# Patient Record
Sex: Female | Born: 1950 | Hispanic: Refuse to answer | Smoking: Former smoker
Health system: Southern US, Community
[De-identification: ages and names within clinical notes are randomized; demographics above are authoritative.]

## PROBLEM LIST (undated history)

## (undated) DIAGNOSIS — R413 Other amnesia: Secondary | ICD-10-CM

## (undated) DIAGNOSIS — G8929 Other chronic pain: Secondary | ICD-10-CM

## (undated) DIAGNOSIS — M797 Fibromyalgia: Secondary | ICD-10-CM

## (undated) DIAGNOSIS — K219 Gastro-esophageal reflux disease without esophagitis: Secondary | ICD-10-CM

## (undated) DIAGNOSIS — I4891 Unspecified atrial fibrillation: Secondary | ICD-10-CM

## (undated) DIAGNOSIS — M1990 Unspecified osteoarthritis, unspecified site: Secondary | ICD-10-CM

## (undated) DIAGNOSIS — G629 Polyneuropathy, unspecified: Secondary | ICD-10-CM

## (undated) DIAGNOSIS — F319 Bipolar disorder, unspecified: Secondary | ICD-10-CM

## (undated) DIAGNOSIS — E119 Type 2 diabetes mellitus without complications: Secondary | ICD-10-CM

## (undated) DIAGNOSIS — Z950 Presence of cardiac pacemaker: Secondary | ICD-10-CM

## (undated) DIAGNOSIS — R112 Nausea with vomiting, unspecified: Secondary | ICD-10-CM

## (undated) DIAGNOSIS — F32A Depression, unspecified: Secondary | ICD-10-CM

## (undated) DIAGNOSIS — B029 Zoster without complications: Secondary | ICD-10-CM

## (undated) DIAGNOSIS — F329 Major depressive disorder, single episode, unspecified: Secondary | ICD-10-CM

## (undated) DIAGNOSIS — G2581 Restless legs syndrome: Secondary | ICD-10-CM

## (undated) DIAGNOSIS — F4310 Post-traumatic stress disorder, unspecified: Secondary | ICD-10-CM

## (undated) DIAGNOSIS — G4730 Sleep apnea, unspecified: Secondary | ICD-10-CM

## (undated) DIAGNOSIS — J449 Chronic obstructive pulmonary disease, unspecified: Secondary | ICD-10-CM

## (undated) DIAGNOSIS — D649 Anemia, unspecified: Secondary | ICD-10-CM

## (undated) HISTORY — PX: SHOULDER ARTHROSCOPY: SHX128

## (undated) HISTORY — PX: CHOLECYSTECTOMY: SHX55

## (undated) HISTORY — PX: ABDOMINAL HYSTERECTOMY: SHX81

## (undated) HISTORY — PX: BREAST SURGERY: SHX581

## (undated) HISTORY — PX: BUNIONECTOMY: SHX129

## (undated) HISTORY — PX: BACK SURGERY: SHX140

## (undated) HISTORY — PX: APPENDECTOMY: SHX54

## (undated) HISTORY — PX: INSERT / REPLACE / REMOVE PACEMAKER: SUR710

---
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
## 1997-07-01 ENCOUNTER — Encounter: Admission: RE | Admit: 1997-07-01 | Discharge: 1997-09-29 | Payer: Self-pay | Admitting: Orthopedic Surgery

## 1997-10-12 ENCOUNTER — Emergency Department (HOSPITAL_COMMUNITY): Admission: EM | Admit: 1997-10-12 | Discharge: 1997-10-12 | Payer: Self-pay | Admitting: Emergency Medicine

## 1997-10-12 ENCOUNTER — Encounter: Payer: Self-pay | Admitting: Emergency Medicine

## 1997-10-13 ENCOUNTER — Emergency Department (HOSPITAL_COMMUNITY): Admission: EM | Admit: 1997-10-13 | Discharge: 1997-10-13 | Payer: Self-pay | Admitting: Emergency Medicine

## 1997-10-14 ENCOUNTER — Ambulatory Visit (HOSPITAL_COMMUNITY): Admission: RE | Admit: 1997-10-14 | Discharge: 1997-10-14 | Payer: Self-pay | Admitting: Urology

## 1997-10-31 ENCOUNTER — Ambulatory Visit (HOSPITAL_BASED_OUTPATIENT_CLINIC_OR_DEPARTMENT_OTHER): Admission: RE | Admit: 1997-10-31 | Discharge: 1997-10-31 | Payer: Self-pay | Admitting: Orthopedic Surgery

## 1998-11-16 ENCOUNTER — Emergency Department (HOSPITAL_COMMUNITY): Admission: EM | Admit: 1998-11-16 | Discharge: 1998-11-16 | Payer: Self-pay | Admitting: Emergency Medicine

## 1998-11-16 ENCOUNTER — Encounter: Payer: Self-pay | Admitting: Emergency Medicine

## 1999-04-13 ENCOUNTER — Encounter: Admission: RE | Admit: 1999-04-13 | Discharge: 1999-05-01 | Payer: Self-pay | Admitting: Orthopedic Surgery

## 1999-05-27 ENCOUNTER — Encounter (INDEPENDENT_AMBULATORY_CARE_PROVIDER_SITE_OTHER): Payer: Self-pay | Admitting: Specialist

## 1999-05-27 ENCOUNTER — Ambulatory Visit (HOSPITAL_COMMUNITY): Admission: RE | Admit: 1999-05-27 | Discharge: 1999-05-27 | Payer: Self-pay | Admitting: Gastroenterology

## 1999-08-10 ENCOUNTER — Encounter: Payer: Self-pay | Admitting: Family Medicine

## 1999-08-10 ENCOUNTER — Encounter: Admission: RE | Admit: 1999-08-10 | Discharge: 1999-08-10 | Payer: Self-pay | Admitting: Family Medicine

## 2000-02-09 ENCOUNTER — Encounter: Admission: RE | Admit: 2000-02-09 | Discharge: 2000-05-09 | Payer: Self-pay | Admitting: Family Medicine

## 2000-05-11 ENCOUNTER — Encounter: Payer: Self-pay | Admitting: Family Medicine

## 2000-05-11 ENCOUNTER — Encounter: Admission: RE | Admit: 2000-05-11 | Discharge: 2000-05-11 | Payer: Self-pay | Admitting: Family Medicine

## 2001-01-06 ENCOUNTER — Ambulatory Visit (HOSPITAL_COMMUNITY): Admission: RE | Admit: 2001-01-06 | Discharge: 2001-01-06 | Payer: Self-pay | Admitting: Family Medicine

## 2001-01-06 ENCOUNTER — Encounter: Admission: RE | Admit: 2001-01-06 | Discharge: 2001-01-06 | Payer: Self-pay | Admitting: Family Medicine

## 2001-01-06 ENCOUNTER — Encounter: Payer: Self-pay | Admitting: Family Medicine

## 2001-08-18 ENCOUNTER — Ambulatory Visit (HOSPITAL_COMMUNITY): Admission: RE | Admit: 2001-08-18 | Discharge: 2001-08-18 | Payer: Self-pay | Admitting: Family Medicine

## 2001-08-18 ENCOUNTER — Encounter: Payer: Self-pay | Admitting: Family Medicine

## 2001-08-23 ENCOUNTER — Encounter: Payer: Self-pay | Admitting: Family Medicine

## 2001-08-23 ENCOUNTER — Encounter: Admission: RE | Admit: 2001-08-23 | Discharge: 2001-08-23 | Payer: Self-pay | Admitting: Family Medicine

## 2001-09-14 ENCOUNTER — Encounter: Admission: RE | Admit: 2001-09-14 | Discharge: 2001-10-26 | Payer: Self-pay | Admitting: Neurosurgery

## 2001-12-11 ENCOUNTER — Encounter: Admission: RE | Admit: 2001-12-11 | Discharge: 2002-02-01 | Payer: Self-pay | Admitting: Neurosurgery

## 2001-12-20 ENCOUNTER — Encounter: Admission: RE | Admit: 2001-12-20 | Discharge: 2001-12-20 | Payer: Self-pay | Admitting: Family Medicine

## 2001-12-20 ENCOUNTER — Encounter: Payer: Self-pay | Admitting: Family Medicine

## 2002-01-02 ENCOUNTER — Encounter: Payer: Self-pay | Admitting: Emergency Medicine

## 2002-01-02 ENCOUNTER — Observation Stay (HOSPITAL_COMMUNITY): Admission: EM | Admit: 2002-01-02 | Discharge: 2002-01-03 | Payer: Self-pay | Admitting: Emergency Medicine

## 2002-01-02 ENCOUNTER — Encounter: Payer: Self-pay | Admitting: Neurology

## 2002-01-04 ENCOUNTER — Encounter: Payer: Self-pay | Admitting: Emergency Medicine

## 2002-01-05 ENCOUNTER — Inpatient Hospital Stay (HOSPITAL_COMMUNITY): Admission: EM | Admit: 2002-01-05 | Discharge: 2002-01-09 | Payer: Self-pay | Admitting: Psychiatry

## 2002-03-05 ENCOUNTER — Ambulatory Visit (HOSPITAL_COMMUNITY): Admission: RE | Admit: 2002-03-05 | Discharge: 2002-03-05 | Payer: Self-pay | Admitting: Neurosurgery

## 2002-03-05 ENCOUNTER — Encounter: Payer: Self-pay | Admitting: Neurosurgery

## 2002-07-12 ENCOUNTER — Encounter: Admission: RE | Admit: 2002-07-12 | Discharge: 2002-10-10 | Payer: Self-pay | Admitting: Family Medicine

## 2002-08-13 ENCOUNTER — Encounter: Admission: RE | Admit: 2002-08-13 | Discharge: 2002-08-13 | Payer: Self-pay | Admitting: Family Medicine

## 2002-08-13 ENCOUNTER — Encounter: Payer: Self-pay | Admitting: Family Medicine

## 2002-09-10 ENCOUNTER — Emergency Department (HOSPITAL_COMMUNITY): Admission: EM | Admit: 2002-09-10 | Discharge: 2002-09-10 | Payer: Self-pay | Admitting: Emergency Medicine

## 2002-09-10 ENCOUNTER — Encounter: Payer: Self-pay | Admitting: Emergency Medicine

## 2003-01-15 ENCOUNTER — Encounter: Admission: RE | Admit: 2003-01-15 | Discharge: 2003-01-15 | Payer: Self-pay | Admitting: Orthopedic Surgery

## 2003-02-12 ENCOUNTER — Encounter: Admission: RE | Admit: 2003-02-12 | Discharge: 2003-02-12 | Payer: Self-pay | Admitting: Family Medicine

## 2003-03-19 ENCOUNTER — Encounter: Admission: RE | Admit: 2003-03-19 | Discharge: 2003-03-19 | Payer: Self-pay | Admitting: Orthopedic Surgery

## 2003-04-15 ENCOUNTER — Ambulatory Visit (HOSPITAL_COMMUNITY): Admission: RE | Admit: 2003-04-15 | Discharge: 2003-04-15 | Payer: Self-pay | Admitting: Neurology

## 2003-08-16 ENCOUNTER — Ambulatory Visit (HOSPITAL_COMMUNITY): Admission: RE | Admit: 2003-08-16 | Discharge: 2003-08-16 | Payer: Self-pay | Admitting: *Deleted

## 2003-09-04 ENCOUNTER — Ambulatory Visit (HOSPITAL_COMMUNITY): Admission: RE | Admit: 2003-09-04 | Discharge: 2003-09-04 | Payer: Self-pay | Admitting: *Deleted

## 2003-09-06 ENCOUNTER — Encounter: Admission: RE | Admit: 2003-09-06 | Discharge: 2003-09-06 | Payer: Self-pay | Admitting: *Deleted

## 2003-12-02 ENCOUNTER — Encounter: Admission: RE | Admit: 2003-12-02 | Discharge: 2004-03-01 | Payer: Self-pay | Admitting: *Deleted

## 2003-12-16 ENCOUNTER — Inpatient Hospital Stay (HOSPITAL_COMMUNITY): Admission: RE | Admit: 2003-12-16 | Discharge: 2003-12-20 | Payer: Self-pay | Admitting: *Deleted

## 2004-03-11 ENCOUNTER — Encounter: Admission: RE | Admit: 2004-03-11 | Discharge: 2004-06-09 | Payer: Self-pay | Admitting: *Deleted

## 2004-07-16 ENCOUNTER — Other Ambulatory Visit (HOSPITAL_COMMUNITY): Admission: RE | Admit: 2004-07-16 | Discharge: 2004-07-28 | Payer: Self-pay | Admitting: Psychiatry

## 2005-07-12 ENCOUNTER — Emergency Department (HOSPITAL_COMMUNITY): Admission: EM | Admit: 2005-07-12 | Discharge: 2005-07-12 | Payer: Self-pay | Admitting: Emergency Medicine

## 2005-07-15 ENCOUNTER — Ambulatory Visit (HOSPITAL_COMMUNITY): Admission: RE | Admit: 2005-07-15 | Discharge: 2005-07-15 | Payer: Self-pay | Admitting: Urology

## 2005-12-20 ENCOUNTER — Encounter: Admission: RE | Admit: 2005-12-20 | Discharge: 2006-03-20 | Payer: Self-pay | Admitting: Family Medicine

## 2006-02-03 ENCOUNTER — Ambulatory Visit (HOSPITAL_BASED_OUTPATIENT_CLINIC_OR_DEPARTMENT_OTHER): Admission: RE | Admit: 2006-02-03 | Discharge: 2006-02-03 | Payer: Self-pay | Admitting: Orthopedic Surgery

## 2006-06-15 ENCOUNTER — Encounter: Admission: RE | Admit: 2006-06-15 | Discharge: 2006-06-15 | Payer: Self-pay | Admitting: Family Medicine

## 2006-06-24 ENCOUNTER — Encounter (INDEPENDENT_AMBULATORY_CARE_PROVIDER_SITE_OTHER): Payer: Self-pay | Admitting: Diagnostic Radiology

## 2006-06-24 ENCOUNTER — Encounter: Admission: RE | Admit: 2006-06-24 | Discharge: 2006-06-24 | Payer: Self-pay | Admitting: Family Medicine

## 2006-10-05 ENCOUNTER — Emergency Department (HOSPITAL_COMMUNITY): Admission: EM | Admit: 2006-10-05 | Discharge: 2006-10-06 | Payer: Self-pay | Admitting: Emergency Medicine

## 2006-10-25 ENCOUNTER — Encounter: Admission: RE | Admit: 2006-10-25 | Discharge: 2006-10-25 | Payer: Self-pay | Admitting: Family Medicine

## 2006-10-26 ENCOUNTER — Ambulatory Visit (HOSPITAL_BASED_OUTPATIENT_CLINIC_OR_DEPARTMENT_OTHER): Admission: RE | Admit: 2006-10-26 | Discharge: 2006-10-26 | Payer: Self-pay | Admitting: Urology

## 2007-01-30 ENCOUNTER — Inpatient Hospital Stay (HOSPITAL_COMMUNITY): Admission: AD | Admit: 2007-01-30 | Discharge: 2007-02-03 | Payer: Self-pay | Admitting: *Deleted

## 2007-01-30 ENCOUNTER — Ambulatory Visit: Payer: Self-pay | Admitting: *Deleted

## 2007-03-15 ENCOUNTER — Ambulatory Visit (HOSPITAL_COMMUNITY): Admission: RE | Admit: 2007-03-15 | Discharge: 2007-03-15 | Payer: Self-pay | Admitting: Neurology

## 2007-05-15 ENCOUNTER — Encounter (HOSPITAL_COMMUNITY): Admission: RE | Admit: 2007-05-15 | Discharge: 2007-08-13 | Payer: Self-pay | Admitting: Neurology

## 2007-06-16 ENCOUNTER — Encounter: Admission: RE | Admit: 2007-06-16 | Discharge: 2007-06-16 | Payer: Self-pay | Admitting: Family Medicine

## 2008-12-24 ENCOUNTER — Inpatient Hospital Stay (HOSPITAL_COMMUNITY): Admission: EM | Admit: 2008-12-24 | Discharge: 2008-12-24 | Payer: Self-pay | Admitting: Emergency Medicine

## 2009-01-07 ENCOUNTER — Inpatient Hospital Stay (HOSPITAL_COMMUNITY): Admission: EM | Admit: 2009-01-07 | Discharge: 2009-01-10 | Payer: Self-pay | Admitting: Emergency Medicine

## 2009-06-04 ENCOUNTER — Ambulatory Visit (HOSPITAL_BASED_OUTPATIENT_CLINIC_OR_DEPARTMENT_OTHER): Admission: RE | Admit: 2009-06-04 | Discharge: 2009-06-04 | Payer: Self-pay | Admitting: Orthopedic Surgery

## 2010-02-11 IMAGING — CT CT ABD-PELV W/ CM
2 of 5 series · 17 of 46 positions shown, 19 images · IV contrast (agent unspecified)
Comparison: 12/23/2008

CLINICAL DATA: Abdominal and pelvic pain.  Fever.

CT ABDOMEN AND PELVIS WITH CONTRAST
TECHNIQUE: Multidetector CT imaging of the abdomen and pelvis was
performed following the standard protocol during bolus
administration of intravenous contrast.
Contrast: 100 ml Hmnipaque-FHH

[Series 2: rtn ap with st · axial · 0.66mm/px · z∈[+352,+752]mm · 14 of 90 slices shown, 16 images]
[im 5/90  soft-tissue]
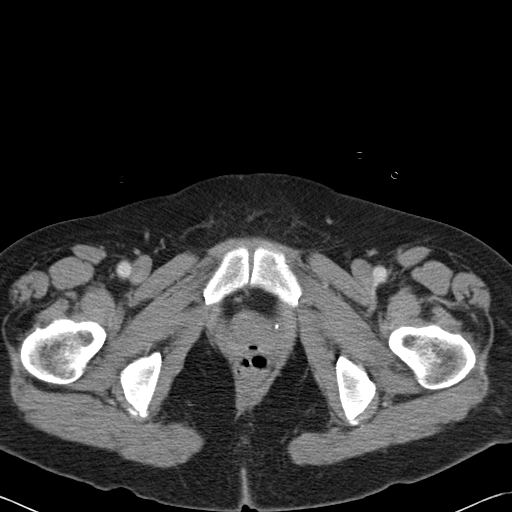
[im 5/90  bone]
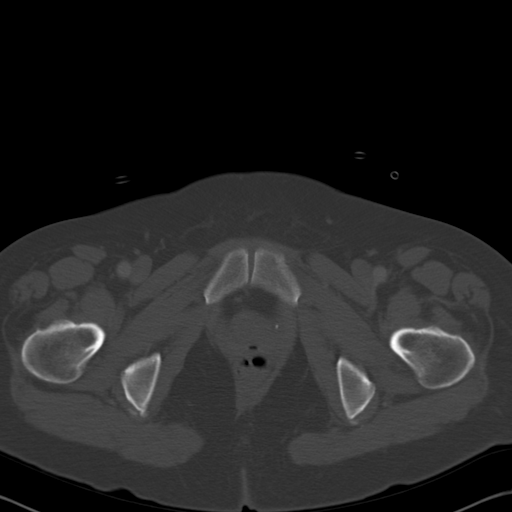
[im 10/90  soft-tissue]
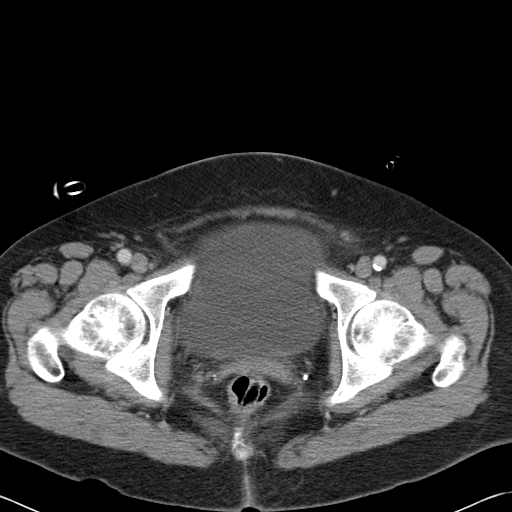
[im 19/90  soft-tissue]
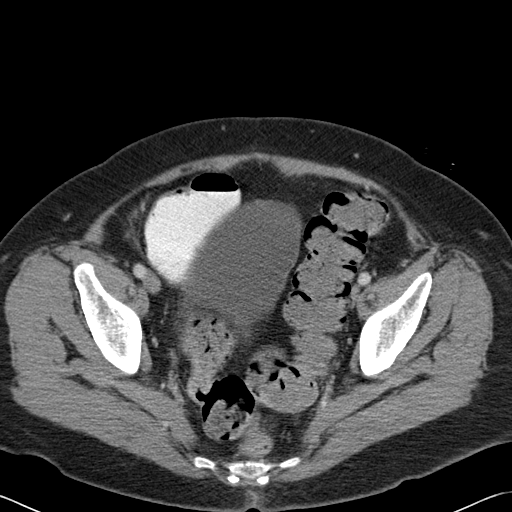
[im 24/90  soft-tissue]
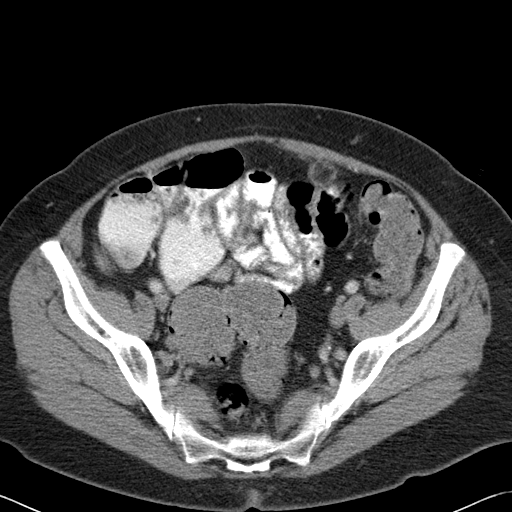
[im 29/90  soft-tissue]
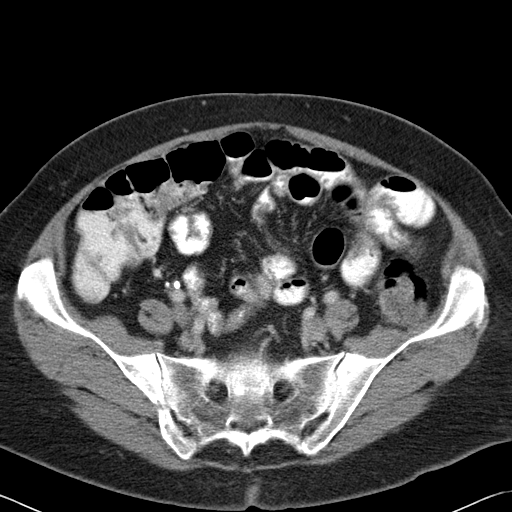
[im 38/90  soft-tissue]
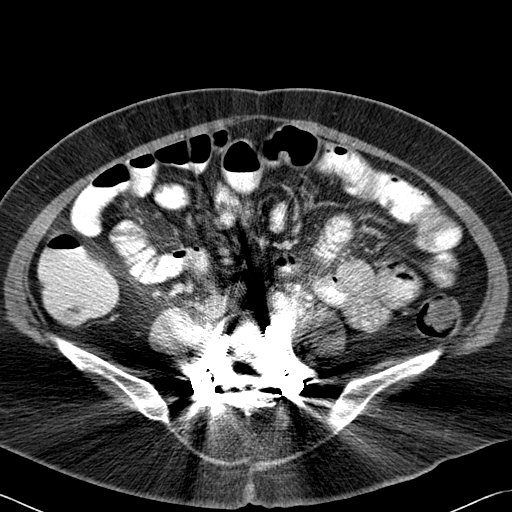
[im 43/90  soft-tissue]
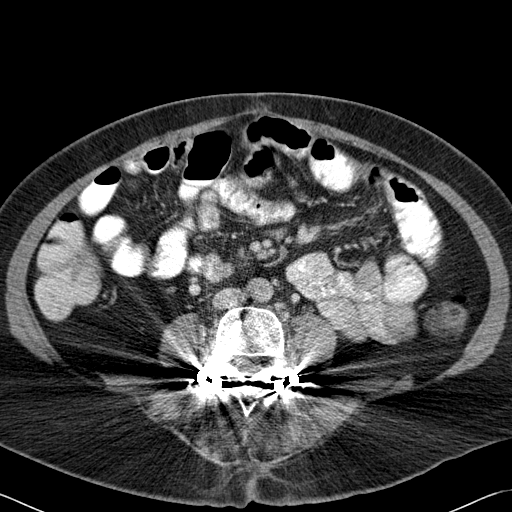
[im 47/90  soft-tissue]
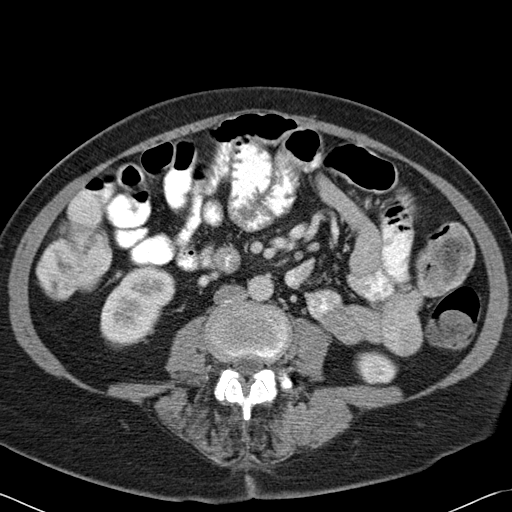
[im 52/90  soft-tissue]
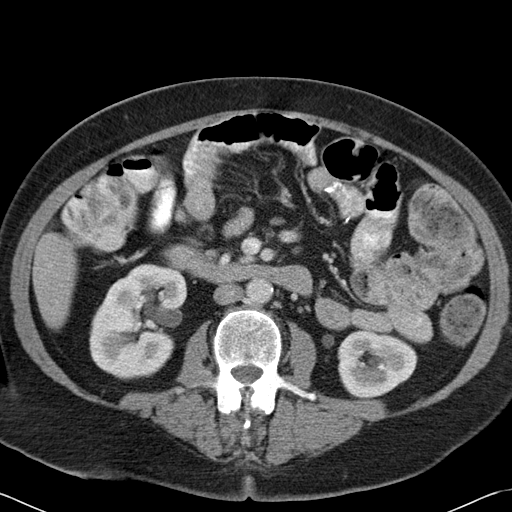
[im 52/90  bone]
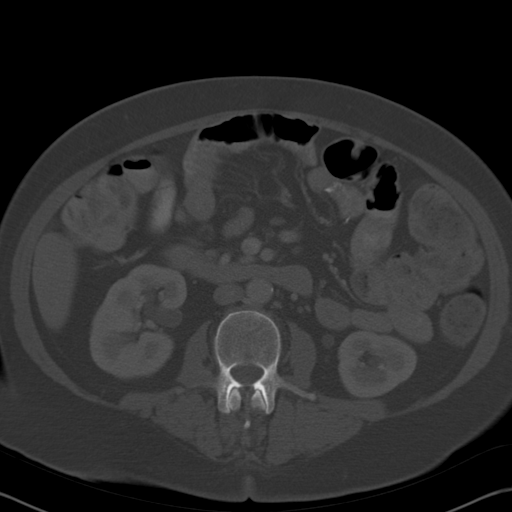
[im 61/90  soft-tissue]
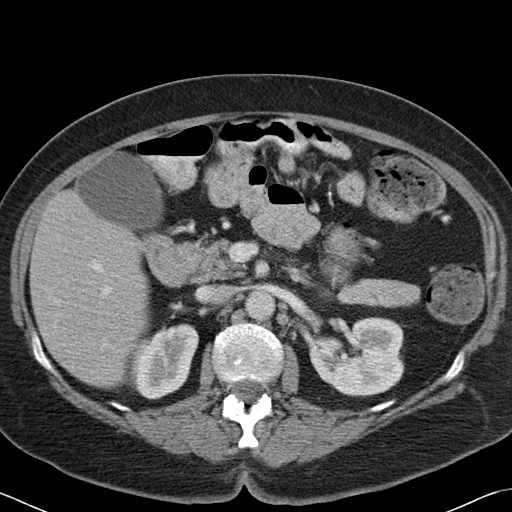
[im 66/90  soft-tissue]
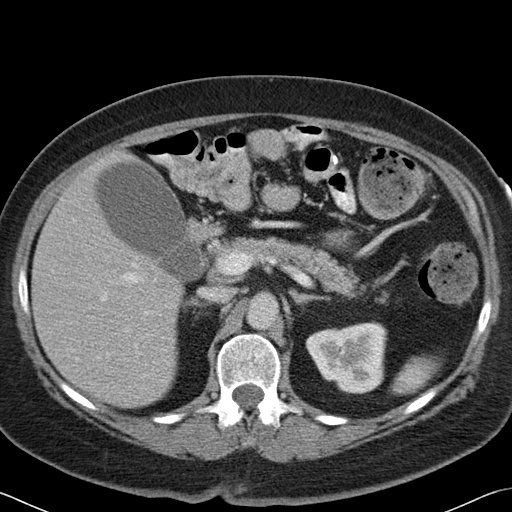
[im 71/90  soft-tissue]
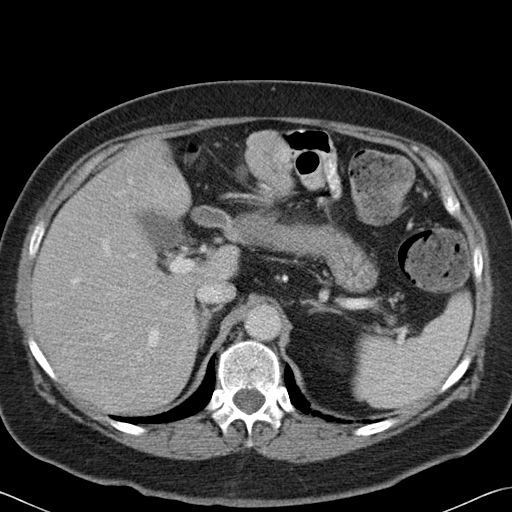
[im 80/90  soft-tissue]
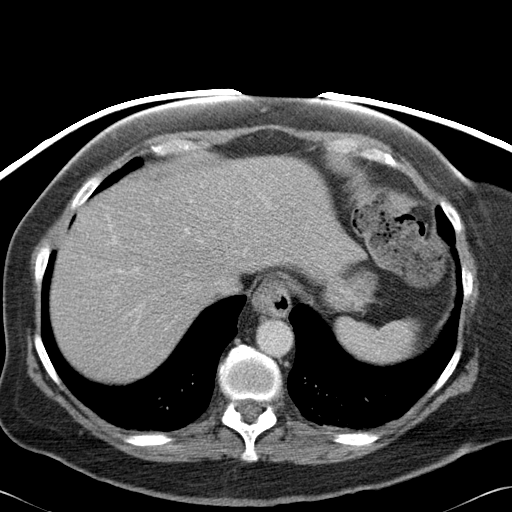
[im 85/90  soft-tissue]
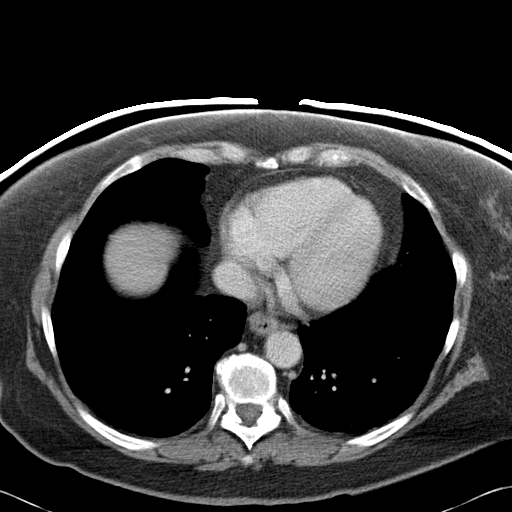

[Series 602: <mpr thick range> · coronal · 0.91mm/px · 3 of 82 slices shown]
[im 28/82  soft-tissue]
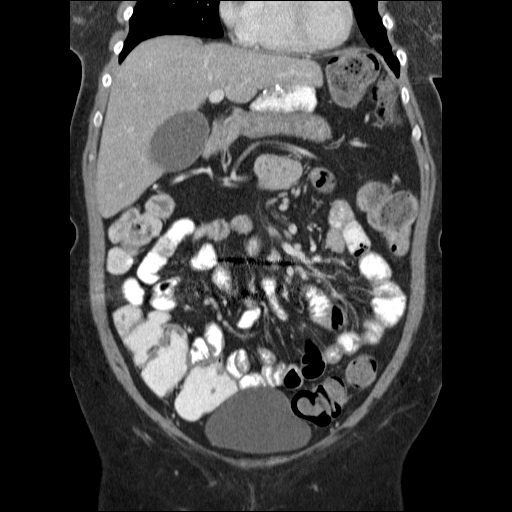
[im 37/82  soft-tissue]
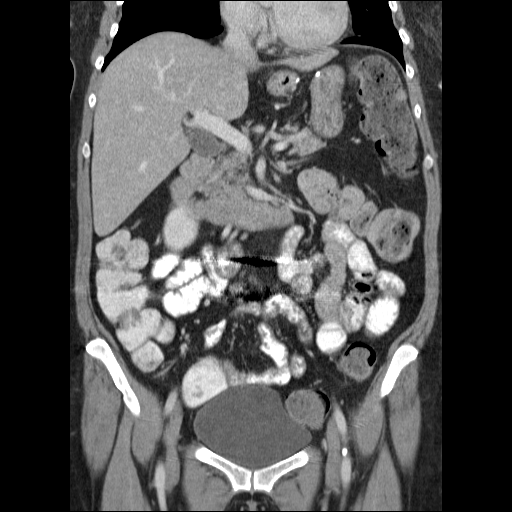
[im 46/82  soft-tissue]
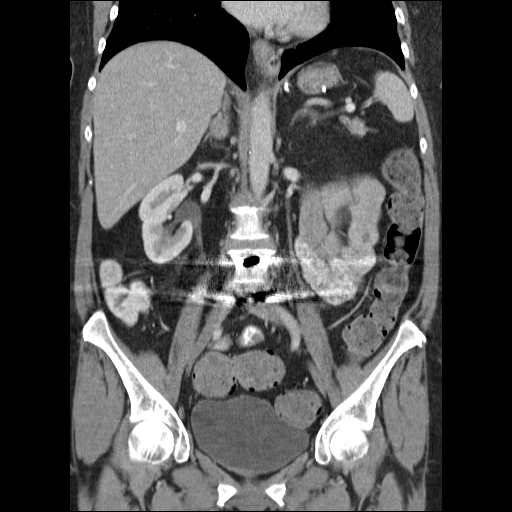

[17 of 46 positions shown; findings below may reference images not displayed]

FINDINGS: The liver, spleen, pancreas, adrenal glands, and kidneys
are normal.  The patient has had previous gastric bypass surgery.
There are no dilated loops of large or small bowel.  Uterus and
ovaries have been removed.  There is no diverticular disease or
free fluid in the pelvis.  Bladder appears normal.  No acute bony
abnormalities.  Evidence of previous lower lumbar fusion.

Stable tiny area of intra-abdominal fat necrosis in the left side
of the pelvis anteriorly.
IMPRESSION: No acute abnormality of the abdomen or pelvis.

## 2010-03-22 LAB — COMPREHENSIVE METABOLIC PANEL
AST: 16 U/L (ref 0–37)
Albumin: 3.1 g/dL — ABNORMAL LOW (ref 3.5–5.2)
Albumin: 3.3 g/dL — ABNORMAL LOW (ref 3.5–5.2)
Alkaline Phosphatase: 104 U/L (ref 39–117)
BUN: 11 mg/dL (ref 6–23)
CO2: 21 mEq/L (ref 19–32)
Calcium: 8.5 mg/dL (ref 8.4–10.5)
Chloride: 112 mEq/L (ref 96–112)
Creatinine, Ser: 0.61 mg/dL (ref 0.4–1.2)
Creatinine, Ser: 0.75 mg/dL (ref 0.4–1.2)
GFR calc Af Amer: >60 mL/min (ref >60)
GFR calc non Af Amer: >60 mL/min (ref >60)
Glucose, Bld: 107 mg/dL — ABNORMAL HIGH (ref 70–99)
Potassium: 3.4 mEq/L — ABNORMAL LOW (ref 3.5–5.1)
Total Bilirubin: 0.5 mg/dL (ref 0.3–1.2)
Total Protein: 6.3 g/dL (ref 6.0–8.3)

## 2010-03-22 LAB — PROTEIN ELECTROPH W RFLX QUANT IMMUNOGLOBULINS
Albumin ELP: 54.9 % — ABNORMAL LOW (ref 55.8–66.1)
Alpha-2-Globulin: 13.4 % — ABNORMAL HIGH (ref 7.1–11.8)
Beta Globulin: 8.5 % — ABNORMAL HIGH (ref 4.7–7.2)
Total Protein ELP: 6.8 g/dL (ref 6.0–8.3)

## 2010-03-22 LAB — CBC
HCT: 23.2 % — ABNORMAL LOW (ref 36.0–46.0)
Hemoglobin: 12.4 g/dL (ref 12.0–15.0)
Hemoglobin: 7.7 g/dL — ABNORMAL LOW (ref 12.0–15.0)
MCHC: 32.5 g/dL (ref 30.0–36.0)
MCHC: 32.8 g/dL (ref 30.0–36.0)
MCHC: 33 g/dL (ref 30.0–36.0)
MCHC: 33.3 g/dL (ref 30.0–36.0)
MCV: 87.6 fL (ref 78.0–100.0)
MCV: 88.3 fL (ref 78.0–100.0)
MCV: 88.6 fL (ref 78.0–100.0)
Platelets: 132 10*3/uL — ABNORMAL LOW (ref 150–400)
Platelets: 172 10*3/uL (ref 150–400)
Platelets: 191 10*3/uL (ref 150–400)
RBC: 2.65 MIL/uL — ABNORMAL LOW (ref 3.87–5.11)
RDW: 14.7 % (ref 11.5–15.5)
RDW: 15.4 % (ref 11.5–15.5)
RDW: 15.5 % (ref 11.5–15.5)
RDW: 15.8 % — ABNORMAL HIGH (ref 11.5–15.5)
WBC: 4.5 10*3/uL (ref 4.0–10.5)
WBC: 7 10*3/uL (ref 4.0–10.5)

## 2010-03-22 LAB — URINALYSIS, ROUTINE W REFLEX MICROSCOPIC
Glucose, UA: NEGATIVE mg/dL
Hgb urine dipstick: NEGATIVE
Specific Gravity, Urine: 1.021 (ref 1.005–1.030)
pH: 5.5 (ref 5.0–8.0)

## 2010-03-22 LAB — DIFFERENTIAL
Basophils Absolute: 0 10*3/uL (ref 0.0–0.1)
Basophils Relative: 0 % (ref 0–1)
Eosinophils Absolute: 0 10*3/uL (ref 0.0–0.7)
Eosinophils Relative: 0 % (ref 0–5)
Eosinophils Relative: 2 % (ref 0–5)
Lymphocytes Relative: 29 % (ref 12–46)
Lymphocytes Relative: 41 % (ref 12–46)
Lymphs Abs: 1.3 10*3/uL (ref 0.7–4.0)
Lymphs Abs: 2.9 10*3/uL (ref 0.7–4.0)
Monocytes Absolute: 0.2 10*3/uL (ref 0.1–1.0)
Monocytes Absolute: 0.4 10*3/uL (ref 0.1–1.0)
Monocytes Relative: 5 % (ref 3–12)
Neutro Abs: 3 10*3/uL (ref 1.7–7.7)
Neutro Abs: 3.6 10*3/uL (ref 1.7–7.7)
Neutrophils Relative %: 66 % (ref 43–77)

## 2010-03-22 LAB — LIPASE, BLOOD: Lipase: 15 U/L (ref 11–59)

## 2010-03-22 LAB — FERRITIN: Ferritin: 24 ng/mL (ref 10–291)

## 2010-03-22 LAB — UIFE/LIGHT CHAINS/TP QN, 24-HR UR
Alpha 1, Urine: DETECTED — AB
Alpha 2, Urine: DETECTED — AB
Free Kappa Lt Chains,Ur: 5.6 mg/dL — ABNORMAL HIGH (ref 0.04–1.51)
Gamma Globulin, Urine: DETECTED — AB

## 2010-03-22 LAB — GLUCOSE, CAPILLARY
Glucose-Capillary: 100 mg/dL — ABNORMAL HIGH (ref 70–99)
Glucose-Capillary: 103 mg/dL — ABNORMAL HIGH (ref 70–99)
Glucose-Capillary: 112 mg/dL — ABNORMAL HIGH (ref 70–99)
Glucose-Capillary: 88 mg/dL (ref 70–99)
Glucose-Capillary: 91 mg/dL (ref 70–99)

## 2010-03-22 LAB — APTT: aPTT: 25 seconds (ref 24–37)

## 2010-03-22 LAB — CROSSMATCH
ABO/RH(D): A POS
Antibody Screen: NEGATIVE

## 2010-03-22 LAB — CULTURE, RESPIRATORY W GRAM STAIN: Culture: NORMAL

## 2010-03-22 LAB — CARDIAC PANEL(CRET KIN+CKTOT+MB+TROPI)
CK, MB: 1.1 ng/mL (ref 0.3–4.0)
Relative Index: 1 (ref 0.0–2.5)
Total CK: 107 U/L (ref 7–177)
Troponin I: 0.03 ng/mL (ref 0.00–0.06)

## 2010-03-22 LAB — BASIC METABOLIC PANEL
CO2: 25 mEq/L (ref 19–32)
Calcium: 8.5 mg/dL (ref 8.4–10.5)
Glucose, Bld: 93 mg/dL (ref 70–99)
Sodium: 138 mEq/L (ref 135–145)

## 2010-03-22 LAB — IRON AND TIBC: Iron: 19 ug/dL — ABNORMAL LOW (ref 42–135)

## 2010-03-22 LAB — EXPECTORATED SPUTUM ASSESSMENT W GRAM STAIN, RFLX TO RESP C

## 2010-03-22 LAB — LITHIUM LEVEL: Lithium Lvl: <0.25 mEq/L — ABNORMAL LOW (ref 0.80–1.40)

## 2010-03-22 LAB — PROTIME-INR: INR: 1.04 (ref 0.00–1.49)

## 2010-03-22 LAB — HEMOCCULT GUIAC POC 1CARD (OFFICE): Fecal Occult Bld: NEGATIVE

## 2010-03-23 LAB — POCT HEMOGLOBIN-HEMACUE: Hemoglobin: 13 g/dL (ref 12.0–15.0)

## 2010-04-06 LAB — HEMOGLOBIN AND HEMATOCRIT, BLOOD
HCT: 35.5 % — ABNORMAL LOW (ref 36.0–46.0)
Hemoglobin: 10.7 g/dL — ABNORMAL LOW (ref 12.0–15.0)
Hemoglobin: 11.6 g/dL — ABNORMAL LOW (ref 12.0–15.0)

## 2010-04-06 LAB — POCT I-STAT, CHEM 8
BUN: 7 mg/dL (ref 6–23)
Calcium, Ion: 1.1 mmol/L — ABNORMAL LOW (ref 1.12–1.32)
Chloride: 110 mEq/L (ref 96–112)
HCT: 39 % (ref 36.0–46.0)
Potassium: 3.9 mEq/L (ref 3.5–5.1)
Sodium: 140 mEq/L (ref 135–145)

## 2010-04-06 LAB — URINALYSIS, ROUTINE W REFLEX MICROSCOPIC
Bilirubin Urine: NEGATIVE
Hgb urine dipstick: NEGATIVE
Nitrite: NEGATIVE
Specific Gravity, Urine: 1.01 (ref 1.005–1.030)
pH: 7 (ref 5.0–8.0)

## 2010-04-06 LAB — PROTIME-INR
INR: 1.04 (ref 0.00–1.49)
Prothrombin Time: 13.5 seconds (ref 11.6–15.2)

## 2010-04-06 LAB — TYPE AND SCREEN
ABO/RH(D): A POS
Antibody Screen: NEGATIVE

## 2010-04-06 LAB — DIFFERENTIAL
Eosinophils Absolute: 0.2 10*3/uL (ref 0.0–0.7)
Lymphocytes Relative: 38 % (ref 12–46)
Lymphs Abs: 4.4 10*3/uL — ABNORMAL HIGH (ref 0.7–4.0)
Monocytes Relative: 5 % (ref 3–12)
Neutrophils Relative %: 54 % (ref 43–77)

## 2010-04-06 LAB — COMPREHENSIVE METABOLIC PANEL
ALT: 17 U/L (ref 0–35)
AST: 18 U/L (ref 0–37)
CO2: 25 mEq/L (ref 19–32)
Calcium: 8.2 mg/dL — ABNORMAL LOW (ref 8.4–10.5)
Creatinine, Ser: 0.49 mg/dL (ref 0.4–1.2)
GFR calc Af Amer: >60 mL/min (ref >60)
GFR calc non Af Amer: >60 mL/min (ref >60)
Sodium: 140 mEq/L (ref 135–145)
Total Protein: 5.5 g/dL — ABNORMAL LOW (ref 6.0–8.3)

## 2010-04-06 LAB — APTT: aPTT: 22 seconds — ABNORMAL LOW (ref 24–37)

## 2010-04-06 LAB — LITHIUM LEVEL: Lithium Lvl: <0.25 mEq/L — ABNORMAL LOW (ref 0.80–1.40)

## 2010-04-06 LAB — CBC
MCV: 89 fL (ref 78.0–100.0)
Platelets: 278 10*3/uL (ref 150–400)
RBC: 4.32 MIL/uL (ref 3.87–5.11)
WBC: 11.5 10*3/uL — ABNORMAL HIGH (ref 4.0–10.5)

## 2010-04-06 LAB — SAMPLE TO BLOOD BANK

## 2010-04-06 LAB — HEMOCCULT GUIAC POC 1CARD (OFFICE): Fecal Occult Bld: POSITIVE

## 2010-05-19 NOTE — Op Note (Signed)
NAME:  Lindsay Payne, Lindsay Payne               ACCOUNT NO.:  [id number]   MEDICAL RECORD NO.:  [id number]          PATIENT TYPE:  AMB   LOCATION:  NESC                         FACILITY:  [facility]   PHYSICIAN:  Excell Seltzer. Annabell Howells, M.D.    DATE OF BIRTH:  04-07-1950   DATE OF PROCEDURE:  10/26/2006  DATE OF DISCHARGE:                               OPERATIVE REPORT   PROCEDURE:  Cystoscopy, right retrograde pyelogram with interpretation,  right ureteroscopy.   PREOPERATIVE DIAGNOSIS:  Right ureterovesical junction stone.   POSTOPERATIVE DIAGNOSIS:  Interval passage of right ureterovesical  junction stone with retained ureteral clot.   SURGEON:  Dr. Bjorn Pippin.   ANESTHESIA:  General.   SPECIMEN:  None.   COMPLICATIONS:  None.   INDICATIONS:  [name] is a 60 year old white female who presented with  acute right flank pain.  She had a CT done in the emergency room that  revealed a 6 mm right distal ureteral stone three days prior her visit.  She had a KUB in the office.  There were calcifications in the pelvis  that were felt to be phleboliths.  The stone was not well seen but on CT  review, the [place] of the stone were not high so it was felt that  it was likely a poorly radiopaque stone and that ureteroscopy was  indicated because her acute symptoms.   FINDINGS AND PROCEDURE:  She was given Cipro.  She was taken operating  room where general anesthetic was induced.  She was placed in the  lithotomy position.  Her perineum and genitalia were prepped with  Betadine solution and she was draped in usual sterile fashion.   Cysto was performed in the usual fashion with a 22-French scope and 12  degrees lens.  Examination revealed a normal urethra.  The bladder wall  was smooth and pale without tumor, stones or inflammation.  The left  ureteral orifice was unremarkable.  The right ureteral orifice had a  small amount of blood/clot at the orifice.   A 5-French open end catheter was placed up  the right ureteral orifice  and contrast was instilled in retrograde fashion.   No obvious filling defects were noted in the ureter from the kidney  down.  There was mild dilation of the ureter.   A 6-French short ureteroscope was then easily passed up the right ureter  to ensure the stone did not flush back toward the kidney.  I was able to  advance the ureteroscope to the level of the UPJ with no evidence of the  stone being seen.  There were inflammatory changes in the distal ureter  consistent with presence of a recent stone and there was a clot that  flushed out after the retrograde.  It was likely causing clot colic and  her pain.  At this point  ureteroscope was removed.  The bladder was drained.  The patient was  taken down from lithotomy position.  Her anesthetic was reversed.  She  was removed to the recovery room in stable condition and there were no  complications.  Excell Seltzer. Annabell Howells, M.D.  Electronically Signed     JJW/MEDQ  D:  10/26/2006  T:  10/27/2006  Job:  [id number]

## 2010-05-22 NOTE — Op Note (Signed)
NAMELASHUN, Lindsay Payne               ACCOUNT NO.:  [id number]   MEDICAL RECORD NO.:  [id number]          PATIENT TYPE:  INP   LOCATION:  0008                         FACILITY[facility]   PHYSICIAN:  Vikki Ports, MDDATE OF BIRTH:  05-22-1950   DATE OF PROCEDURE:  12/16/2003  DATE OF DISCHARGE:                                 OPERATIVE REPORT   PREOPERATIVE DIAGNOSIS:  Morbid obesity.   POSTOPERATIVE DIAGNOSIS:  Morbid obesity.   OPERATION PERFORMED:  Laparoscopic Roux-en-Y gastric bypass, anticolic,  antigastric.   SURGEON:  Vikki Ports, MD   ASSISTANT:  Thornton Park. Daphine Deutscher, MD   ANESTHESIA:  General.   DESCRIPTION OF PROCEDURE:  The patient was taken to the operating room and  placed in supine position after adequate general anesthesia was induced  using endotracheal tube.  The abdomen was prepped and draped in the normal  sterile fashion.  Using the OptiView technique, a 12 mm trocar was placed in  the left upper quadrant and four additional 12 mm ports were placed under  direct visualization after the pneumoperitoneum had been obtained.  An  additional 5 mm trocar was placed in the left abdomen.  A number of  adhesions to the omentum were taken down from the anterior abdominal wall.  The ligament of Treitz was identified and the small bowel was divided using  a 60 mm GIA stapling device, 40 cm distal to the ligament of Treitz.  The  Roux limb was marked with a Penrose drain and 100 cm distal to this was  identified.  The Roux limb was brought up left of the biliary limb and a  side-to-side jejunojejunostomy was performed in the standard fashion using a  4 to 5 mm white load GIA.  The enterotomy was closed with a running 2-0  Vicryl suture.  Tisseel was placed over the anastomosis and the mesenteric  defect was closed with a running silk suture.  I then turned my attention to  the upper abdomen. Nathanson retractor was placed in the upper abdomen.  A  left  lateral segment was retracted anteriorly.  The angle of His was  dissected down with blunt and sharp cautery and a blunt dissector into the  lesser sac.  An area 4 cm distal to the esophagogastric junction was  identified along the lesser curve and a gastric pouch was created after the  lesser sac had been entered.  This took up three firings of the 60 mm blue  load GIA stapler.  The final division was accomplished with an Ewald tube  through the esophagogastric junction.  I was very satisfied with the look of  the pouch and a side-to-side jejunojejunostomy was performed in a standard  fashion with posterior serosal layer of 2-0 Vicryl, side-to-side  gastrojejunostomy was created with a 45 mm white load GIA. The defect was  closed with running 2-0 Vicryl.  With the [facility] through the anastomosis, the  anterior serosal layer was closed.  The upper abdomen was copiously  irrigated and then upper endoscopy was performed after the Roux limb had  been clamped.  This showed no evidence of leak and a very patent  anastomosis.  The length of the pouch was approximately 5 cm.  Drain was placed.  Pneumoperitoneum was released.  Skin was closed with  staples.  The patient tolerated the procedure well and went to PACU in good  condition.  Upper endoscopy will be dictated in a separate report by Molli Hazard  B. Daphine Deutscher, MD.     Lindsay Payne   KRH/MEDQ  D:  12/16/2003  T:  12/16/2003  Job:  [id number]

## 2010-05-22 NOTE — H&P (Signed)
NAME:  Lindsay Payne, Lindsay Payne                         ACCOUNT NO.:  [id number]   MEDICAL RECORD NO.:  [id number]                   PATIENT TYPE:  IPS   LOCATION:  0302                                 [facility]ACILITY[facility]  [facility]   PHYSICIAN:  Geoffery Lyons, M.D.                   DATE OF BIRTH:  06/19/1950   DATE OF ADMISSION:  01/05/2002  DATE OF DISCHARGE:  01/09/2002                         PSYCHIATRIC ADMISSION ASSESSMENT   IDENTIFYING INFORMATION:  This is a 60 year old, white, married female who  presents voluntarily.   REASON FOR ADMISSION AND SYMPTOMS:  She states that she was admitted for  depression and psychosis.  She states that she is taking hydrocodone for  back pain and Elavil, but it was not working.  She is in need for medication  stabilization.  Will participate in groups and work on Pharmacologist.  The  patient is to be going to IOP, but is in need of referral to a therapist or  a counselor.   The patient had been seen several times in the last few weeks in the  emergency room and was worked up for spells where she could not move her  arms and legs and her tongue would feel thick.  She would stare.  She stays  in bed.  She says that she is not hopeless, helpless, or worthless.  She  denies active suicidal ideation, but if God would take her, she is ready.  She thinks that she worries too much and she does acknowledge elevated  sleep.   PAST PSYCHIATRIC HISTORY:  She has been treated in the past with Elavil and  Prozac.  She was followed by Dr. Renae Fickle or Bryan Lemma. Manus Gunning, M.D.  Twenty-  five years ago, she was divorced with two children and tried to suicide.  She was hospitalized at [facility] and stayed a few weeks.  Since that time,  she has taken Prozac.   SOCIAL HISTORY:  This is her second married.  She has been married 21 years  to this husband.  She has a daughter age 76 and a son age 31.  She has been  disabled for 10 years since her first back surgery.   FAMILY HISTORY:   Depression has always been in her family.   ALCOHOL AND DRUG USE:  She denies it.   PRIMARY MEDICAL DOCTOR:  Bryan Lemma. Manus Gunning, M.D.   PAST MEDICAL HISTORY:  She is treated for diabetes, high cholesterol,  hypertension, and gastrointestinal reflux disease.   CURRENT MEDICATIONS:  1. Nexium 40 mg p.o. daily.  2. Estradiol 2 mg daily.  3. Paxil 30 mg daily.  4. Crestor 10 mg daily.  5. Metformin 1000 mg daily.  6. Elavil 50 mg at h.s.  7. Altace 5 mg daily.  8. Actos 45 mg daily.   DRUG ALLERGIES:  PENICILLIN and SULFA (she gets hives) and CODEINE (she gets  sick).  PHYSICAL EXAMINATION:  GENERAL APPEARANCE:  A somewhat obese white female in  no acute distress.  She had no remarkable physical findings.  LUNGS:  Clear to auscultation and percussion.  HEART:  Regular rate and rhythm.  EXTREMITIES:  She had no cyanosis, clubbing, or edema.   REVIEW OF SYSTEMS:  Except for her back surgeries, she feels that she is in  relatively good shape physically.  Her skin revealed surgical scars.  She  does endorse frequent headaches.  She currently is being treated for a sinus  infection.  She has had recent bronchitis.  She has reflux and irritable  bowel syndrome.  She is status post a total abdominal hysterectomy and does  have hot flashes, but is treated for that.  She has had type 2 diabetes for  one year.   MENTAL STATUS EXAMINATION:  Her mood was bland.  Her affect was flat.  She  was cooperative.  Her sensorium was clear.  Her eye contact was poor.  She  spoke slowly.  Her associations were tight.  She was oriented x 3.  Her  psychomotor was somewhat depressed.  Her thoughts revealed some paranoia and  some thought blocking.  She was neatly groomed and dressed, although obese.  Her memory had some impairment.  Her judgment and insight were fair.  Her  concentration was decreased.  Her intelligence was average.   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   AXIS II:  Deferred.   AXIS III:  1. Diabetes, type 2.  2. Gastrointestinal reflux disease.  3. Hypercholesterolemia.  4. Hypertension.  5. Degenerative disk disease.   PLAN:  The patient is to be admitted to have medication adjustment, to  discuss her medical care with her primary care physician, Bryan Lemma. Ehinger,  M.D., and to help her learn how to discuss her feelings and issues and  minimize her drug use.     Mickie Adams- Deery, P.A.-C.              Geoffery Lyons, M.D.    MA/MEDQ  D:  02/01/2002  T:  02/02/2002  Job:  [id number]

## 2010-05-22 NOTE — Procedures (Signed)
[facility]  Patient:    Lindsay Payne, Lindsay Payne                        MRN: [id number] Proc. Date: 05/27/99 Attending:  Everardo All. Madilyn Fireman, M.D. CC:         Dyanne Carrel, M.D.                           Procedure Report  PROCEDURE:  Colonoscopy with polypectomy.  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  DESCRIPTION OF PROCEDURE:  The patient was placed in the left lateral decubitus position and placed on the pulse monitor with continuous low-flow oxygen delivered by nasal cannula.  She was sedated with 100 mg IV Demerol and 9 mm IV Versed.  The Olympus video colonoscope was inserted into the rectum and advanced to the cecum, confirmed by transillumination at McBurneys point and visualization of the ileocecal valve and appendiceal orifice.  The prep was good.  The cecum appeared normal.  There was some mahogany-colored fluid, which was suctioned up into a trap and heme-tested with results pending. Within the ascending colon was seen a small 6-8 mm sessile polyp, which was fulgurated by hot biopsy.  The transverse, descending, and sigmoid colon appeared normal with no further polyps, masses, diverticula, or other mucosal abnormalities.  It should be noted that there was significant sharp angulation at 30 cm that I could not traverse with the standard scope, necessitating that I withdraw this scope and use a pediatric colonoscope.  With some difficulty, I was able to negotiate the angle and advance the scope relatively easily the rest of the way.  On withdrawal through this area, I could not appreciate any definite stricture, although I had seen some fibrosis on a previous study in 1998.  There were some minimal patchy areas of fibrosis in the rectosigmoid area below the area that was difficult to traverse.  This was consistent with previous healed ischemia.  The  rectum appeared normal and on withdrawal, there were some fairly engorged internal hemorrhoids seen but no bleeding.  The scope was then withdrawn and the patient returned to the recovery room in stable condition.  She tolerated the procedure well, and There were no immediate complications.  IMPRESSION: 1. Ascending colon polyp. 2. Internal hemorrhoids. 3. Minimal rectosigmoid fibrosis, presumed from previous ischemic colitis.  PLAN: 1. Await Hemoccult testing on the stool from the right colon. 2. Will consider barium enema to see if significant stricture of the sigmoid    colon.  Further recommendations to follow. DD:  05/27/99 TD:  06/01/99 Job: [id number]

## 2010-05-22 NOTE — Discharge Summary (Signed)
NAMEINEZE, SERRAO               ACCOUNT NO.:  [id number]   MEDICAL RECORD NO.:  [id number]          PATIENT TYPE:  INP   LOCATION:  0483                         FACILITY[facility]   PHYSICIAN:  Vikki Ports, MDDATE OF BIRTH:  14-Mar-1950   DATE OF ADMISSION:  12/16/2003  DATE OF DISCHARGE:                                 DISCHARGE SUMMARY   ADMISSION DIAGNOSIS:  Morbid obesity.   DISCHARGE DIAGNOSIS:  Morbid obesity.   PROCEDURES:  Laparoscopic Roux-en-Y gastric bypass and diagnostic  laparoscopy.   CONDITION ON DISCHARGE:  Good and improved.   FOLLOW-UP:  In my office in 3 days.   DISPOSITION:  Discharged home.   HOSPITAL COURSE:  The patient was admitted after home bowel prep and  underwent laparoscopic Roux-en-Y gastric bypass.  Follow-up upper GI on  postoperative day #1 had a question of a small amount of extravasation.  On  postoperative day #2, the patient was having worsening nausea and mild  abdominal pain.  Repeat upper GI showed no evidence of leak.  However,  because of clinical suspicion I took her back to the operating room and  performed diagnostic laparoscopy.  There was no evidence of disruption of  either of the anastomoses and the abdomen was benign.  Over the next 36  hours the patient continued to improve, was walking and tolerating her  protein shakes, and was ready for discharge home.     Gaylyn Rong   KRH/MEDQ  D:  12/20/2003  T:  12/20/2003  Job:  [id number]

## 2010-05-22 NOTE — Discharge Summary (Signed)
Lindsay Payne, GALLUS NO.:  [id number]   MEDICAL RECORD NO.:  [id number]          PATIENT TYPE:  IPS   LOCATION:  0302                          [facility]ACILITY[facility]  [facility]   PHYSICIAN:  Jasmine Pang, M.D. DATE OF BIRTH:  [birth year]   DATE OF ADMISSION:  01/30/2007  DATE OF DISCHARGE:  02/03/2007                               DISCHARGE SUMMARY   IDENTIFICATION:  This is a 60 year old female who was admitted on a  voluntary basis on January 30, 2007.   HISTORY OF PRESENT ILLNESS:  The patient has a history of depression.  She has passive suicidal ideation, don't want to be around and can't  do this.  She also has an active plan to shoot herself with a gun at  home.  She states she is tired of all the negativity.  Her husband has  been unemployed for months.  They have lot of credit card debt.  Her  husband is not totally supportive of her depression.  She has been  having trouble sleeping.  She has not been caring for herself, been  staying in her pajamas and isolating herself.   PAST PSYCHIATRIC HISTORY:  The patient was here 5 years ago.  She is  seen at [facility] by Dr. Jules Schick.  Her therapist  is [employer].   FAMILY HISTORY:  None.   ALCOHOL AND DRUG HISTORY:  No alcohol or drug use.   MEDICAL PROBLEMS:  1. Back pain.  2. Two back and neck surgeries by Dr. Renae Fickle.  3. RLS.  4. Rosacea.   DRUG ALLERGIES:  1. PENICILLIN.  2. MORPHINE.  3. CODEINE.  4. SULFA.   PHYSICAL FINDINGS:  Upon physical exam, the patient was healthy with no  acute medical or surgical problems noted.   ADMISSION LABORATORIES:  TSH was 4.909.  Magnesium was 2.4, alkaline  phosphatase was 140.  CBC revealed a hemoglobin of 11.6 and a hematocrit  of 34.8.  WBC was high at 11.5.  The rest of the  CBC was within normal  limits.  Routine chemistry panel grossly within normal limits except for  a slightly elevated alkaline phosphatase at 140.  Urinalysis was   negative.   HOSPITAL COURSE:  Upon admission, the patient was continued on her home  medications of fluoxetine 60 mg daily, Neurontin 600 mg p.o. t.i.d.,  Estrace 2 mg p.o. daily, ReQuip 1.5 mg daily, amitriptyline 50 mg p.o.  q.h.s., and doxycycline 100 mg p.o. daily for rosacea.  She was also  continued on her metronidazole gel0.75% apply to the face for rosacea.  On January 31, 2007, she was started on Ambien 10-20 mg p.o. q.h.s.  p.r.n. insomnia.  Initially, the patient was reserved in sessions with  me.  When I first met with her, she was lying in bed and was tearful.  She was very depressed with suicidal ideation (shoot self).  She was  having difficulty participating in unit therapeutic groups and  activities to begin with.  She discussed her losses and the fact that  her husband cannot find a job.  On February 01, 2007, Prozac was  discontinued and she was started on Celexa 20 mg daily, and she  discussed further the stress she and her husband are under.  She stated  her husband planned to start therapy.  She was in agreement to get him  in for family session.  She states she has shut herself off from others.  A family session was held on February 01, 2007.  Husband was supportive.  They discussed the adjustment of husband being home all the time and how  to be together and how to give each other spare time.  As  hospitalization progressed, the patient's mental status improved.  She  began to be able to participate appropriately in unit therapeutic groups  and activities.  Mood was less depressed and less anxious.  There was no  suicidal ideation.  Sleep was good and appetite was good.  She felt very  good about the family session with her husband.  On February 03, 2007,  mental status had improved markedly from admission status.  The patient  felt ready for discharge.  Mood was less depressed and less anxious.  Affect wide range.  There was no suicidal or homicidal ideation.  No   thoughts of self-injurious behavior.  No auditory or visual  hallucinations.  No paranoia or delusions.  Thoughts were logical and  goal-directed.  Thought content, no predominant theme.  Cognitive was  grossly back to baseline.  It was felt the patient would be safe for  discharge.   DISCHARGE DIAGNOSES:  Axis I:  Major depressive disorder, recurrent,  severe without psychosis.  AXIS II:  None.  Axis III:  Restless legs syndrome,  back pain with 2 back and neck  surgeries, rosacea.  Axis IV:  Severe (economic problems, problems with primary support  group, burden of psychiatric illness, other psychosocial problems,  medical problems).  Axis V:  Global assessment of functioning (GAF) was 48 on discharge.  GAF was 35 upon admission.  GAF highest past year was 65-70.   DISCHARGE PLANS:  There were no specific activity level or dietary  restrictions.   POSTHOSPITAL CARE PLANS:  The patient will see Dr. Jules Schick on  February 4th at 10 a.m.  She will also continue therapy with her  outpatient therapist, Phylliss Blakes, at [facility].  She  had an appointment for this.   DISCHARGE MEDICATIONS:  1. Elavil 50 mg at bedtime.  2. Doxycycline 100 mg daily.  3. Estradiol 2 mg daily.  4. ReQuip 1.5 mg at bedtime.  5. Neurontin 600 mg p.o. t.i.d.  6. Celexa 20 mg daily.  7. Metronidazole gel, apply to face as directed for rosacea.  8. Ambien 10 mg 1 p.o. q.h.s. p.r.n. insomnia.      Jasmine Pang, M.D.  Electronically Signed     BHS/MEDQ  D:  02/20/2007  T:  02/20/2007  Job:  [id number]

## 2010-05-22 NOTE — Op Note (Signed)
NAMELAXMI, CHOUNG               ACCOUNT NO.:  [id number]   MEDICAL RECORD NO.:  [id number]          PATIENT TYPE:  INP   LOCATION:  0165                         FACILITY:  [facility]   PHYSICIAN:  Vikki Ports, MDDATE OF BIRTH:  [birth year]   DATE OF PROCEDURE:  12/18/2003  DATE OF DISCHARGE:                                 OPERATIVE REPORT   PREOPERATIVE DIAGNOSIS:  Abdominal pain, rule out anastomotic leak.   POSTOPERATIVE DIAGNOSIS:  No evidence of anastomotic leak.   PROCEDURE:  Diagnostic laparoscopy, status post laparoscopic Roux-en-Y  gastritic bypass.   SURGEON:  Vikki Ports, MD   ASSISTANT:  Thornton Park. Daphine Deutscher, MD   ANESTHESIA:  General.   DESCRIPTION OF PROCEDURE:  Patient was taken to the operating room and  placed in supine position.  After adequate general anesthesia was induced,  the previous staples were removed.  Using the Optiview technique, the  previous trocar site was cannulated, and a 12 mm port was placed in the left  upper quadrant.  Under direct visualization, additional ports were placed in  the abdomen through the previous incisions.  On inspecting both the  jejunojejunostomy as well as the gastrojejunostomy, there was no evidence of  intra-abdominal fluid other than some clear fluid in the left upper  quadrant, consistent with irrigation.  Both anastomoses were inspected and  appeared to be intact.  The jejunojejunostomy appeared in good position.  There was no evidence of leak.  Pneumoperitoneum was released.  Trocars were  removed.  Skin was closed with staples.     Gaylyn Rong   KRH/MEDQ  D:  12/18/2003  T:  12/18/2003  Job:  [id number]

## 2010-05-22 NOTE — Discharge Summary (Signed)
NAME:  Lindsay Payne, Lindsay Payne                         ACCOUNT NO.:  [id number]   MEDICAL RECORD NO.:  [id number]                   PATIENT TYPE:  IPS   LOCATION:  0302                                 FACILITY:  [facility]   PHYSICIAN:  Jeanice Lim, M.D.              DATE OF BIRTH:  [birth year]   DATE OF ADMISSION:  01/05/2002  DATE OF DISCHARGE:  01/09/2002                                 DISCHARGE SUMMARY   IDENTIFYING DATA:  This is a 60 year old Caucasian female admitted with a  history of conversion disorder.  The patient experiences blacking out and  appears to be in a coma.  The patient may have been over-taking medications  which caused confusion prior to admission.   ADMISSION MEDICATIONS:  Ativan 0.5 q.6, Nexium 40 mg daily, Estradiol 2 mg  daily, Paxil 30 mg daily, Crestor 10 mg daily, Metformin 1000 mg daily,  Elavil 50 mg q.h.s., Altace 5 mg daily, Actos 45 mg daily and Claritin  p.r.n.   ALLERGIES:  PENICILLIN, SULFA, CODEINE.   PHYSICAL EXAMINATION:  Essentially within normal limits, neurologically  nonfocal.   ROUTINE ADMISSION LABS:  Essentially within normal limits.   MENTAL STATUS EXAM:  The patient's mood was bland, affect flat, cooperative,  fair eye contact.  Speech somewhat slow, psychomotor retarded.  Thoughts  slowed, possible paranoid ideation, scattered thinking at times with  decreased concentration, otherwise cognition was intact.  Judgment and  insight poor.   ADMISSION DIAGNOSES:   AXIS I:  1. History of conversion disorder.  2. Psychotic disorder not otherwise specified.   AXIS II:  None.   AXIS III:  Diabetes type II, gastroesophageal reflux disease, elevated  cholesterol, hypertension, degenerative joint disease.   AXIS IV:  Moderate problems with primary support group.   AXIS V:  35/70.   HOSPITAL COURSE:  The patient was admitted and ordered routine p.r.n.  medications, underwent further monitoring, and was encouraged to participate  in  individual, group and milieu therapy.  The patient improved somewhat  after being administered medications, apparently had been over-taking  medications and became confused.  Reported thinking became more clear after  being administered medications and reported no history of responding to  Paxil in the past, requesting to be changed to Effexor XR.  Therefore Paxil  was tapered and discontinued and Effexor XR was started.  The patient  reported tolerating medication changes and clinical intervention, with a  positive response and no side effects from medications.   CONDITION ON DISCHARGE:  Improved.  Mood was more euthymic, affect brighter,  thought process goal directed, thinking was more clear.  There were no  psychotic symptoms nor dangerous ideation,  The patient reported motivation  to be follow up with IOP and to continue cross titration onto Effexor and  take medications as prescribed.   DISCHARGE MEDICATIONS:  1. Seroquel 25 mg q.h.s.  2. Paxil 10 mg q.a.m. for  2 days, then stop.  3. Effexor XR 75 mg q.a.m. starting on the day after discharge.  4. Elavil 50 mg q.h.s.  5. Protonix 40 mg 2 q.a.m.  6. Esterase 1 mg 2 q.a.m.  7. Glucophage 500 mg 2 q.a.m.  8. Actos 45 mg q.a.m.  9. Altace 5 mg q.a.m.   DISPOSITION:  The patient was to continue Alavert and Crestor as previously  prescribed and follow up with the IOP program on January 8 at 9 a.m.   DISCHARGE DIAGNOSES:   AXIS I:  1. History of conversion disorder.  2. Psychotic disorder not otherwise specified.   AXIS II:  None.   AXIS III:  Diabetes type II, gastroesophageal reflux disease, elevated  cholesterol, hypertension, degenerative joint disease.   AXIS IV:  Moderate problems with primary support group.   AXIS V:  Global assessment of function on discharge  was 55.                                                 Jeanice Lim, M.D.    JEM/MEDQ  D:  01/30/2002  T:  01/30/2002  Job:  [id number]

## 2010-05-22 NOTE — H&P (Signed)
NAME:  Lindsay Payne, Lindsay Payne                           ACCOUNT NO.:  [id number]   MEDICAL RECORD NO.:  [id number]                   PATIENT TYPE:   LOCATION:                                       FACILITY:   PHYSICIAN:  Michael L. Thad Ranger, M.D.           DATE OF BIRTH:  11-May-1950   DATE OF ADMISSION:  01/02/2002  DATE OF DISCHARGE:                                HISTORY & PHYSICAL   CHIEF COMPLAINT:  Stop talking and weakness.   HISTORY OF PRESENT ILLNESS:  This is the initial [facility]  admission for this 60 year old woman with a past medical history which  includes the recent cervical disk surgery about four weeks ago.  Per report,  she was doing her therapy today at the [facility] outpatient facility on  [facility] when she suddenly became unable to talk except for small  words, with a lot of difficulty.  EMS was alerted and they found her on the  scene to be able to move all extremities roughly equally, but had difficulty  with speech; with a lot of effort could get out simple words.  They reported  that she looked anxious.  There was no history of loss of consciousness or  seizures.  She reportedly developed arm weakness in route though there aer  conflicting reports about exactly which side was weak.  She also was felt to  have developed a right gaze deviation in route.  Upon examination at the  [facility] Emergency Room, the patient was looking around fully and her  speech was very labored, but she was able to briskly follow complex  commands.  She had give way weakness of all extremities.  There is no known  history of previous stroke.  (Witnesses to the event are not available.)   PAST MEDICAL HISTORY:  Remarkable for cervical laminectomy with fusion  performed about four weeks ago by Dr. Annia Friendly in [facility], it is unclear  what level this was performed at.  She has also had three lumbar surgeries  which resulted in subsequent disability.  Other previous surgeries  have been  rotator cuff surgery and foot surgery.  She has a history of diabetes  diagnosed about a year ago which is not under good control with oral  medications.  There is no known history of hypertension.  She does have an  elevated cholesterol.  She saw Dr. Vickey Huger in August for memory problems  which were felt to be due to depression and other medical problems.  She has  had a history of depression and anxiety.   FAMILY HISTORY:  Her father died of an MI at age 4, mother died of a  stroke.   SOCIAL HISTORY:  She lives with her second husband.  There is a lot of  stress going on her life due to her children's divorce  She is disabled due  to her back.  She has not used tobacco or alcohol.   ALLERGIES:  No known drug allergies.   MEDICATIONS:  Paxil 50 mg a day; Estradiol 2 mg q.d.; Elavil 75 mg q.h.s.;  metformin 1000 mg q.a.m.; Actos 45 mg q.d.; Nexium 40 mg q.d.; Altace 5 mg  q.d.; _______ 10 mg q.d.   REVIEW OF SYSTEMS:  Limited due to the patient's language history.  She  apparently has had problems with headaches for about six months and there is  also recent subjective memory problem.  She is felt to have sinus problems  by her husband as well as a weight gain with some swelling recently and a  little bit of dizziness when getting out of bed.   PHYSICAL EXAMINATION:  VITAL SIGNS:  Temperature 97.5, blood pressure  140/79, pulse 102, respirations 16.  GENERAL/MENTAL STATUS:  She appears awake, alert and in no acute distress.  She is clearly very anxious.  She was able to briskly follow one, two and  three step commands.  Her speech is labored, very efforted and when she  speaks, she speaks in monosyllables.  Later examinations did reveal that she  could speak more easily and was able to answer simple questions, was  oriented times three.  HEAD:  Cranium is normocephalic and atraumatic.  NECK:  Supple, without carotid bruits.  CHEST:  Clear to auscultation.  HEART:   Tachycardic with regular rhythm and no murmurs.  ABDOMEN:  Obese, but otherwise benign.  EXTREMITIES:  Trace edema, normal pulses.  NEUROLOGICAL:  Cranial nerves; pupils are equal and briskly reactive.  She  spontaneously looks in all directions and blinks to threat from both sides.  Face, tongue and palate move normally and symmetrically.  Gag reflex is  present.  Motor; normal bulk and tone.  She is able to bear weight with  great effort, move all extremities against gravity.  She does briefly  maintain strength against gravity with sudden withdrawal of support and is  able to do things such as prevent her hands from falling into her face.  Sensory; withdrawal from pain is present in all extremities.  Reflexes are  generally increased.  Toes are downgoing.  Cerebellar, rapid movement and  gait exams are all deferred.   LABORATORY DATA:  CBC; white cells 11.4, hemoglobin 12.2, platelets 266,000.  CMET is remarkable for elevated glucose of 195 and low albumin of 3.2.  Coagulations are normal.  CT of the head is personally reviewed and  demonstrates mild atrophy with a few old lacunes, but no acute findings.   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  Need to rule out TIA as she does have risk factors,  but that is felt to be unlikely.   PLAN:  Will check MRI/MRA of the brain, and admit for observation.  If the  MRI is normal, will likely proceed to a psychiatric consultation.                                               Michael L. Thad Ranger, M.D.    MLR/MEDQ  D:  01/02/2002  T:  01/02/2002  Job:  [id number]

## 2010-05-22 NOTE — Op Note (Signed)
NAME:  HUYEN, PERAZZO               ACCOUNT NO.:  [id number]   MEDICAL RECORD NO.:  [id number]          PATIENT TYPE:  AMB   LOCATION:  NESC                         FACILITY:  [facility]   PHYSICIAN:  Deidre Ala, M.D.    DATE OF BIRTH:  08-Feb-1950   DATE OF PROCEDURE:  02/03/2006  DATE OF DISCHARGE:                               OPERATIVE REPORT   PREOPERATIVE DIAGNOSES:  1. Right fifth toe bunionette.  2. Right fourth toe overlap with soft tissue deformity plantar right      fourth toe.  3. Right fourth hammertoe.  4. Right fifth hammertoe.  5. Right great toe painful osteophyte at medial interphalangeal joint.   POSTOPERATIVE DIAGNOSES:  1. Right fifth toe bunionette.  2. Right fourth toe overlap with soft tissue deformity plantar right      fourth toe.  3. Right fourth hammertoe.  4. Right fifth hammertoe.  5. Right great toe painful osteophyte at medial interphalangeal joint.   PROCEDURE:  1. Right fourth toe soft tissue procedure overlap revision of      deformity plantar pad curly right fourth toe.  2. Right fifth bunionette excision.  3. Right fourth hammertoe correction with derotation and defreeze      procedure.  4. Right fifth toe hammertoe correction with defreeze procedure and      derotation.  5. Right great toe painful osteophyte excision with partial phalangeal      base excision and condylectomy.   SURGEON:  1. Charlesetta Shanks, M.D.   ASSISTANT:  Phineas Semen, P.A.   ANESTHESIA:  General with LMA.   CULTURES:  None.   DRAINS:  None.   BLOOD LOSS:  Minimal.   TOURNIQUET TIME:  Esmarch 66 minutes.   PATHOLOGIC FINDINGS AND HISTORY:  This patient came in complaining of  these deformities with pressure in her foot with her shoes and desired  re-construction.  She had has had similar surgery on the other side with  good result.  We did a partial metatarsal head excision of the right  fifth bunionette excision with exostectomy and took out part of the  condylar base of the great toe of the fifth toe that was prominent.  We  also took off a large osteophyte through a medial incision and partial  condyle of the medial IP joint of the right great toe IP joint.  Her  hammertoe corrections of the right fourth derotated as well as produced  from a varus position to more valgus, more at the DIP joint with  defreeze bolsters and the right fifth was more slight derotation and the  PIP joint with defreeze bolsters for hammertoe and then we did an  excision of a molded plantar surface of the right fourth toe overlap  where she had molded the toe pad which was very painful for curly fourth  toe.   PROCEDURE:  With adequate anesthesia obtained using LMA technique, 300  mg Cleocin given IV prophylaxis.  The patient was placed in the supine  position.  The right foot was prepped from the toes or the upper calf in  the standard fashion.  After standard prepping and draping, Esmarch  exsanguination was used and the tourniquet was left on the foot.  We  first started on the fifth bunionette where a dorsal lateral incision  was made and dissection was carried down to the metatarsal head  exostosis, was removed with a saw as well as the phalangeal base of the  fifth.  We also removed bone on the undersurface of the fifth metatarsal  doing a subtotal metatarsal head excision and then there was no  prominence.  The capsule was closed with 0 Vicryl and the skin with 4-0  nylon running.  I then made an incision over the right great toe  obliquely across the IP joint medially, dissected down to the large  osteophytes and condyles and removed them with rongeur and then closed  that wound with vertical mattress sutures of 4-0 nylon.  I then took a  dart out of the sharply molded plantar pad of the distal fourth toe and  sutured that with 4-0 nylon interrupted.  I then did defreeze wedges of  skin and bone out of the right fourth DIP joint and right fifth PIP.   I  derotated both and used a valgus and converted a varus mallet toe on the  right fourth to more straight and more valgus so that the toes came off  straight radially, and closed those down with 4-0 nylon sutures and the  classic defreeze technique with Xeroform bolsters.  A bulky sterile  compressive forefoot dressing was applied after application of 0.5%  Marcaine plain distally about 20 mL.  Ace wrap was placed, wooden sole  shoe.  The patient having tolerated the procedure well was awakened,  taken to recovery room in satisfactory condition to be discharged per  outpatient routine.  Given Percocet and Demerol for pain, elevation,  crutches, weightbearing as tolerated on her heel.  Told to call the  office for a recheck next week.  Laboratory data within normal limits.           ______________________________  V. Charlesetta Shanks, M.D.     VEP/MEDQ  D:  02/03/2006  T:  02/03/2006  Job:  [id number]

## 2010-05-22 NOTE — Consult Note (Signed)
NAME:  Lindsay Payne, Lindsay Payne               ACCOUNT NO.:  [id number]   MEDICAL RECORD NO.:  [id number]          PATIENT TYPE:  INP   LOCATION:  0455                         FACILITY:  [facility]   PHYSICIAN:  Corinna L. Lendell Caprice, MDDATE OF BIRTH:  05-Nov-1950   DATE OF CONSULTATION:  12/16/2003  DATE OF DISCHARGE:                                   CONSULTATION   REASON FOR CONSULTATION:  Diabetes.   IMPRESSIONS/RECOMMENDATIONS:  1.  Status post laparoscopic Rieux on Y gastric bypass for morbid obesity.  2.  Type 2 diabetes.  Recommend holding her oral hypoglycemics until she is      eating.  For now I will order CBGs and sliding scale insulin.  If her      blood sugars are uncontrolled.  She may need to be switched to a      nondextrose IV fluid.  3.  Hyperlipidemia.  4.  Peripheral neuropathy.  5.  Depression.  6.  Osteoarthritis.  I will hold all of her medications until she is eating.      She does, however, take a proton pump inhibitor at home and I will order      IV Protonix.   HISTORY OF PRESENT ILLNESS:  Lindsay Payne is a pleasant 60 year old white  female who had a laparoscopic Rieux on Y gastric bypass.  Her primary care  physician is Dr. Manus Gunning, and I was called by Dr. Luan Pulling to consult for  diabetes.  Currently the patient has minimal pain.  She had some nausea  earlier which has resolved with antiemetic.   PAST MEDICAL HISTORY:  As above.  In addition there is report of conversion  disorder in reviewing old records.   MEDICATIONS:  1.  Heparin 5000 units subcutaneously q.8h.  2.  At home she takes Neurontin 600 mg p.o. t.i.d.  3.  NPH insulin 35 units subcutaneously in the morning, 25 units      subcutaneously q.h.s.  4.  Vicodin as needed.  5.  Metformin 1000 mg p.o. b.i.d.  6.  Cyclobenzaprine 10 mg as needed.  7.  Actos 45 mg p.o. daily.  8.  Clorazepate 7.5 mg p.o. q.i.d.  9.  Estradiol 2 mg p.o. daily.  10. Seroquel 25 mg two p.o. q.h.s.  11. Altace 5 mg p.o. daily  for renal purposes.  12. Lamictal 100 mg p.o. daily.  13. Lexapro.  Dose is not recorded.  14. Crestor 10 mg p.o. daily.  15. Amitriptyline 50 mg p.o. q.h.s.  16. Prilosec 20 mg p.o. daily.   SOCIAL HISTORY:  The patient does not drink.  She quit smoking two years  ago.   FAMILY HISTORY:  Noncontributory.   REVIEW OF SYSTEMS:  As above.  Otherwise negative.   PHYSICAL EXAMINATION:  GENERAL:  The patient is in no acute distress.  VITAL SIGNS:  Please see nursing notes for vital signs.  HEENT:  Normocephalic, atraumatic.  Pupils equal, round, and reactive to  light.  Sclerae nonicteric.  Moist mucous membranes.  NECK:  Supple.  No lymphadenopathy.  No JVD.  No thyromegaly.  LUNGS:  Clear to auscultation bilaterally without wheezes, rhonchi, or  rales.  CARDIOVASCULAR:  Regular rate and rhythm without murmurs, gallops, or rubs.  ABDOMEN:  She has several dressings over the trochar sites.  Her abdomen is  soft, appropriately tender.  EXTREMITIES:  No clubbing, cyanosis, or edema.  Pulses are intact.  She has  [place] in  place.  NEUROLOGIC:  The patient is slightly somnolent, but oriented.  Cranial  nerves and sensory/motor exam are intact.  PSYCHIATRIC:  Normal affect.  SKIN:  No rash.   LABORATORY DATA:  She has laboratories from August 2005, but I do not see  any in the computer.   EKG from August 12 shows normal sinus rhythm.   I would like to thank Dr. Luan Pulling for this consultation and we will  continue to follow for diabetes.     Cori   CLS/MEDQ  D:  12/16/2003  T:  12/16/2003  Job:  [id number]   cc:   Bryan Lemma. Manus Gunning, M.D.  [address] [place]  [place] [address]  Fax: [phone]

## 2010-05-22 NOTE — Op Note (Signed)
NAMEGIAH, Lindsay Payne               ACCOUNT NO.:  [id number]   MEDICAL RECORD NO.:  [id number]          PATIENT TYPE:  INP   LOCATION:  0455                         FACILITY:  [facility]   PHYSICIAN:  Thornton Park. Daphine Deutscher, MD  DATE OF BIRTH:  1950/08/12   DATE OF PROCEDURE:  12/16/2003  DATE OF DISCHARGE:                                 OPERATIVE REPORT   PREOPERATIVE DIAGNOSIS:  Morbid obesity undergoing laparoscopic Roux-en-Y  gastric bypass.   POSTOPERATIVE DIAGNOSIS:  Morbid obesity undergoing laparoscopic Roux-en-Y  gastric bypass.   OPERATION PERFORMED:  Upper endoscopy.   SURGEON:  Thornton Park. Daphine Deutscher, MD   INDICATIONS FOR PROCEDURE:  This is a 60 year old lady undergoing Roux-en-Y  gastric bypass.   DESCRIPTION OF PROCEDURE:  At completion of the gastrojejunostomy I went to  the head of the table and passed the esophagogastroduodenoscope without  difficulty.  This was passed down measuring the esophagogastric junction at  40 cm and a 5 cm pouch.  The pouch was insufflated to allow it to be tested  with pressure distension and no evidence of a leak was noted.  The  gastrojejunostomy was identified and looked good and healthy.  There was no  evidence of ischemia or bleeding.  The stomach was decompressed and the  scope was withdrawn.  The patient tolerated the procedure well.     Matt   MBM/MEDQ  D:  12/16/2003  T:  12/16/2003  Job:  [id number]   cc:   Vikki Ports, MD  [address], [address]  [place]  [place] [address]  Fax: [phone]

## 2010-07-23 ENCOUNTER — Other Ambulatory Visit: Payer: Self-pay | Admitting: Gastroenterology

## 2010-07-23 ENCOUNTER — Ambulatory Visit
Admission: RE | Admit: 2010-07-23 | Discharge: 2010-07-23 | Disposition: A | Discharge status: Home / Self Care | Payer: Medicare Other | Source: Ambulatory Visit | Attending: Gastroenterology | Admitting: Gastroenterology

## 2010-07-23 DIAGNOSIS — R109 Unspecified abdominal pain: Secondary | ICD-10-CM

## 2010-07-23 DIAGNOSIS — K5900 Constipation, unspecified: Secondary | ICD-10-CM

## 2010-07-23 MED ORDER — IOHEXOL 300 MG/ML  SOLN
100.0000 mL | Freq: Once | INTRAMUSCULAR | Status: AC | PRN
  Administered 2010-07-23: 100 mL via INTRAVENOUS

## 2010-09-24 LAB — COMPREHENSIVE METABOLIC PANEL
ALT: 19
AST: 22
Alkaline Phosphatase: 140 — ABNORMAL HIGH
CO2: 28
Calcium: 9.2
Chloride: 106
GFR calc Af Amer: >60
GFR calc non Af Amer: >60
Glucose, Bld: 81
Potassium: 4.7
Sodium: 140
Total Bilirubin: 0.6

## 2010-09-24 LAB — MAGNESIUM: Magnesium: 2.4

## 2010-09-24 LAB — URINALYSIS, ROUTINE W REFLEX MICROSCOPIC
Nitrite: NEGATIVE
Specific Gravity, Urine: 1.011
Urobilinogen, UA: 0.2
pH: 6

## 2010-09-24 LAB — TSH: TSH: 4.909

## 2010-09-24 LAB — CBC
Hemoglobin: 11.6 — ABNORMAL LOW
MCHC: 33.3
RBC: 4.32
WBC: 11.5 — ABNORMAL HIGH

## 2010-10-14 LAB — POCT HEMOGLOBIN-HEMACUE
Hemoglobin: 11.3 — ABNORMAL LOW
Operator id: 114531

## 2010-10-15 LAB — COMPREHENSIVE METABOLIC PANEL
ALT: 14
Alkaline Phosphatase: 115
CO2: 25
Chloride: 109
Glucose, Bld: 93
Potassium: 3.6
Sodium: 142
Total Protein: 6.7

## 2010-10-15 LAB — CBC
Hemoglobin: 12
RBC: 4.24
RDW: 14.5 — ABNORMAL HIGH
WBC: 9.2

## 2010-10-15 LAB — DIFFERENTIAL
Basophils Relative: 1
Eosinophils Absolute: 0.1
Monocytes Absolute: 0.5
Monocytes Relative: 6
Neutrophils Relative %: 61

## 2010-10-15 LAB — ETHANOL: Alcohol, Ethyl (B): <5

## 2010-10-15 LAB — ACETAMINOPHEN LEVEL: Acetaminophen (Tylenol), Serum: <10 — ABNORMAL LOW

## 2011-08-26 IMAGING — CR DG ABDOMEN ACUTE W/ 1V CHEST
3 series · 3 of 3 positions shown · non-contrast
Comparison: 10/26/2006 and 01/07/2009

CLINICAL DATA: Abdominal pain.  Constipation.

ACUTE ABDOMEN SERIES (ABDOMEN 2 VIEW & CHEST 1 VIEW)

[w chest pa]
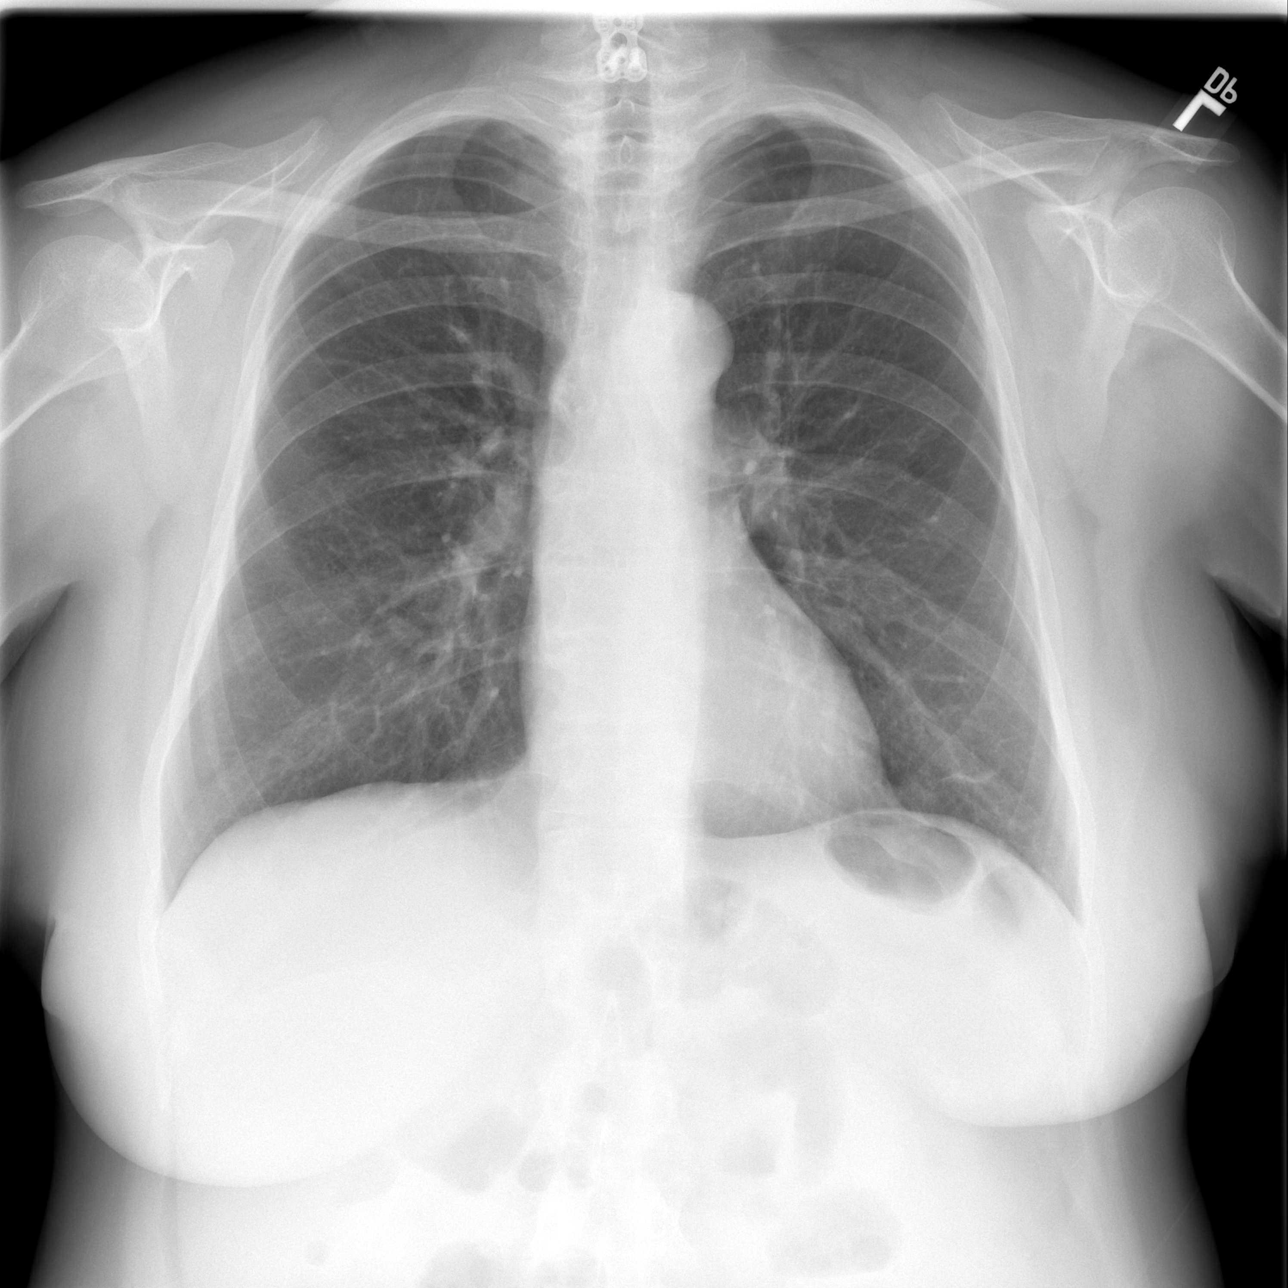

[w abdomen upright *]
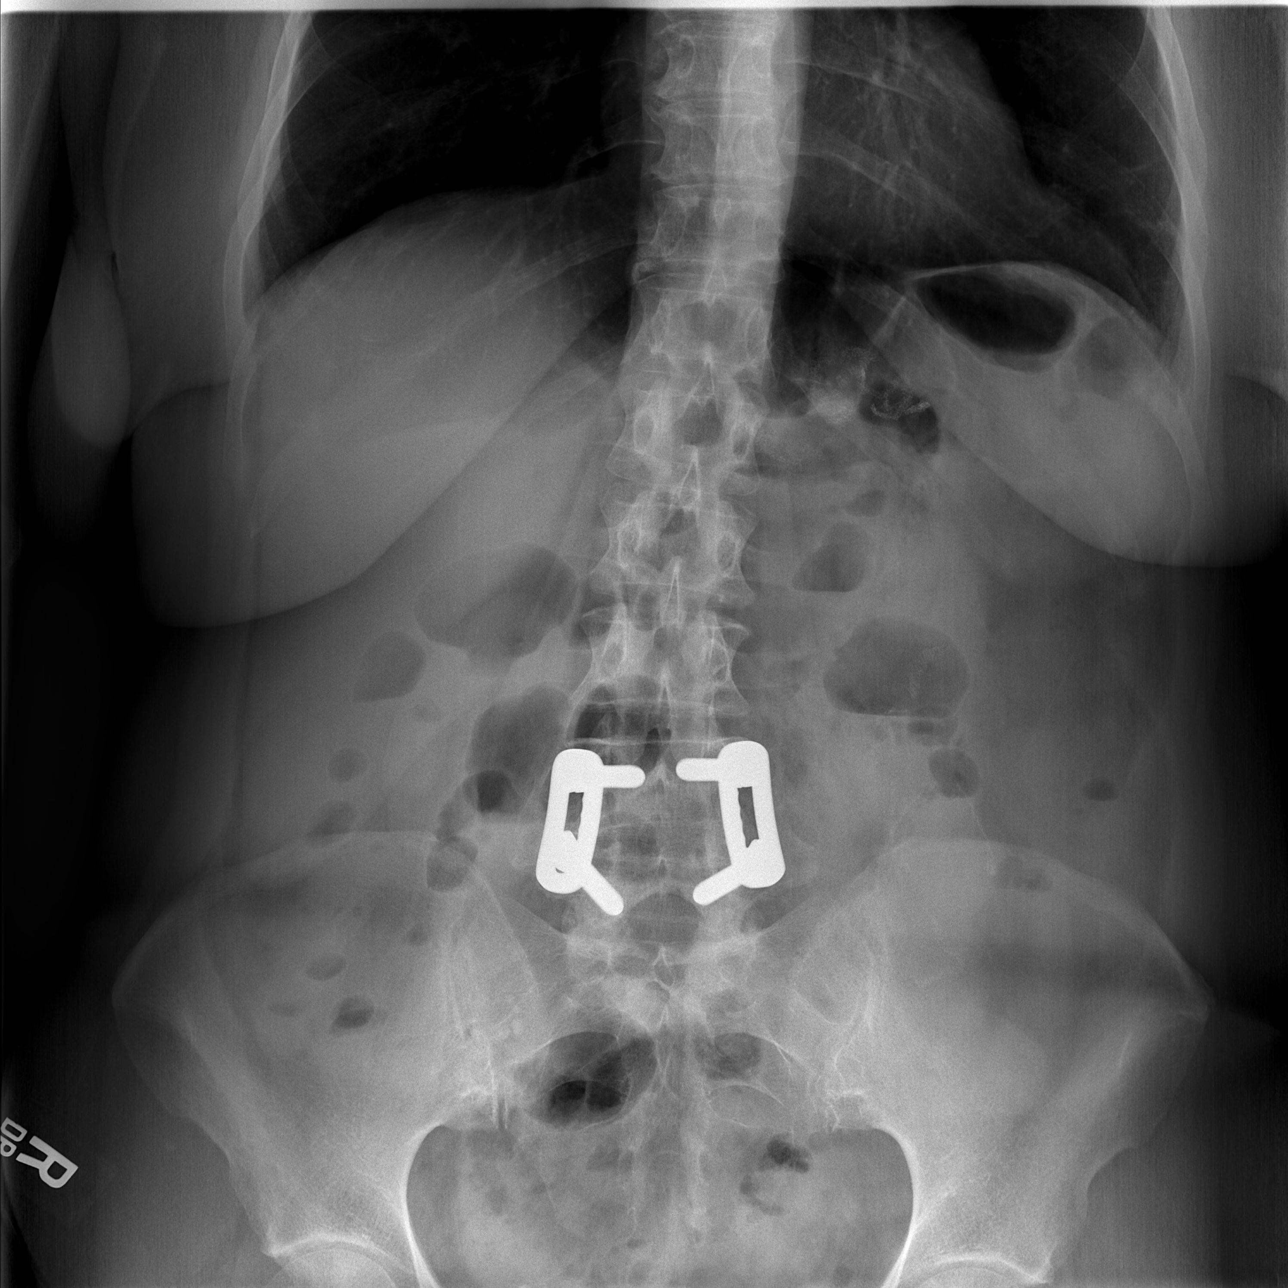

[t abdomen supine]
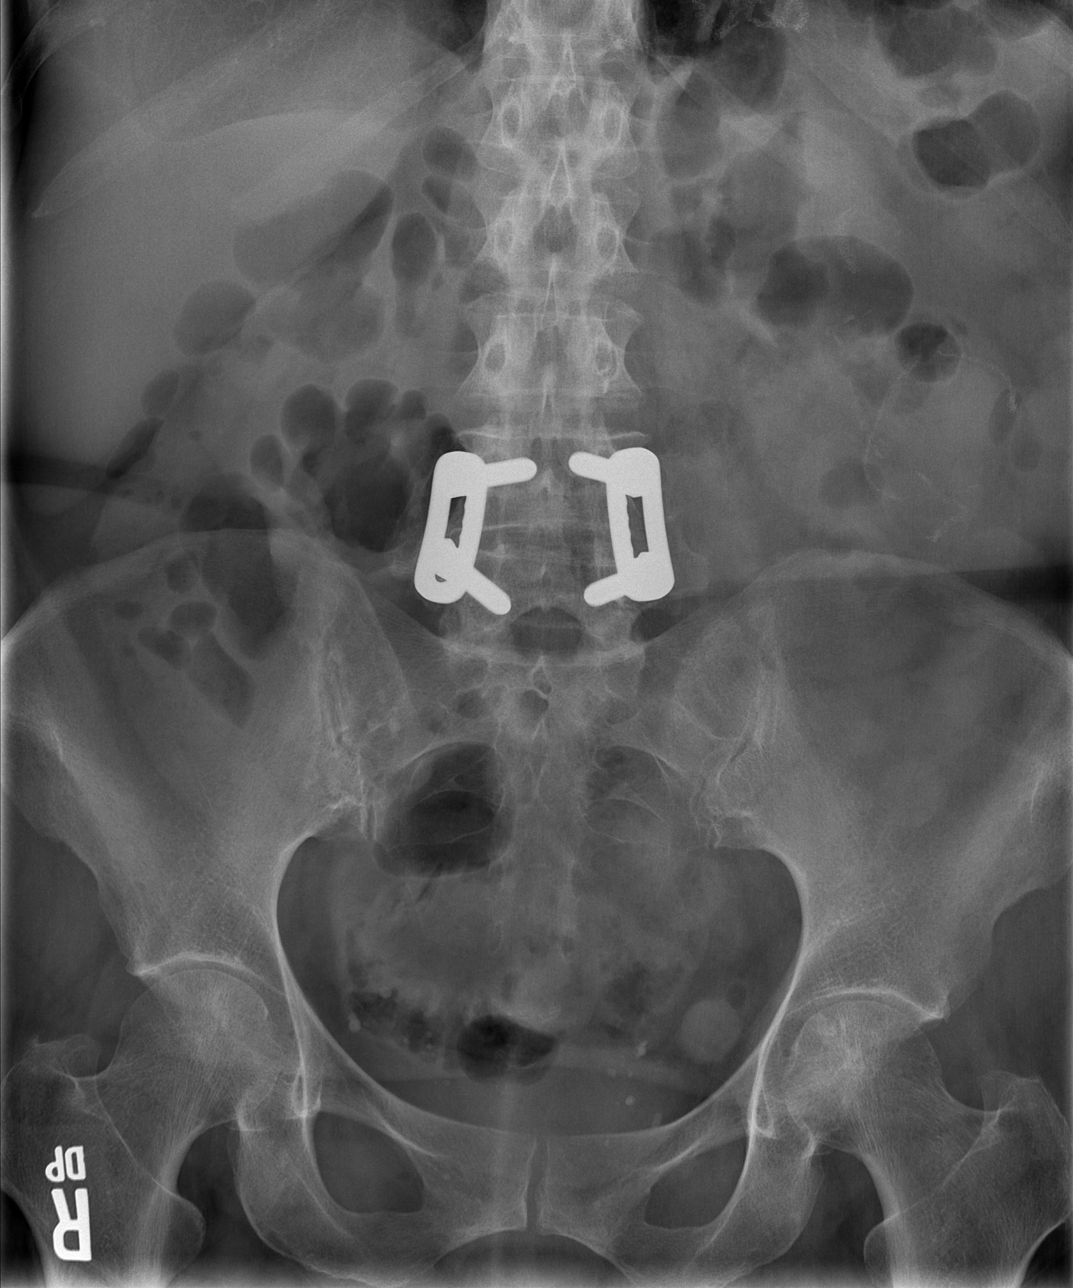

[3 of 3 positions shown; findings below may reference images not displayed]

FINDINGS: Scattered air fluid levels are seen, but there is no
evidence of dilated bowel loops.  There is no evidence of free air.
Lumbar spine fusion hardware noted.  Surgical staples seen in the
left abdomen.

Heart size is normal.  Both lungs are clear.  Mild scarring at left
lung base is unchanged.
IMPRESSION: 1.  Nonspecific, nonobstructive bowel gas pattern.
2.  No active cardiopulmonary disease.

## 2012-11-08 ENCOUNTER — Institutional Professional Consult (permissible substitution): Payer: Medicare Other | Admitting: Neurology

## 2014-01-04 DIAGNOSIS — I639 Cerebral infarction, unspecified: Secondary | ICD-10-CM

## 2014-01-04 HISTORY — DX: Cerebral infarction, unspecified: I63.9

## 2015-01-05 HISTORY — PX: GASTRIC BYPASS: SHX52

## 2019-01-03 ENCOUNTER — Other Ambulatory Visit: Payer: Self-pay | Admitting: Orthopaedic Surgery

## 2019-02-07 ENCOUNTER — Other Ambulatory Visit: Payer: Self-pay | Admitting: Orthopaedic Surgery

## 2019-02-15 ENCOUNTER — Encounter (HOSPITAL_BASED_OUTPATIENT_CLINIC_OR_DEPARTMENT_OTHER): Payer: Self-pay | Admitting: Orthopaedic Surgery

## 2019-02-15 ENCOUNTER — Other Ambulatory Visit: Payer: Self-pay

## 2019-02-16 ENCOUNTER — Other Ambulatory Visit (HOSPITAL_COMMUNITY)
Admission: RE | Admit: 2019-02-16 | Discharge: 2019-02-16 | Disposition: A | Discharge status: Home / Self Care | Payer: Medicare Other | Source: Ambulatory Visit | Attending: Orthopaedic Surgery | Admitting: Orthopaedic Surgery

## 2019-02-16 DIAGNOSIS — Z20822 Contact with and (suspected) exposure to covid-19: Secondary | ICD-10-CM | POA: Diagnosis not present

## 2019-02-16 DIAGNOSIS — Z01812 Encounter for preprocedural laboratory examination: Secondary | ICD-10-CM | POA: Insufficient documentation

## 2019-02-16 LAB — SARS CORONAVIRUS 2 (TAT 6-24 HRS): SARS Coronavirus 2: NEGATIVE

## 2019-02-20 ENCOUNTER — Encounter (HOSPITAL_BASED_OUTPATIENT_CLINIC_OR_DEPARTMENT_OTHER): Payer: Self-pay | Admitting: Orthopaedic Surgery

## 2019-02-20 ENCOUNTER — Ambulatory Visit (HOSPITAL_BASED_OUTPATIENT_CLINIC_OR_DEPARTMENT_OTHER): Payer: Medicare Other | Admitting: Certified Registered"

## 2019-02-20 ENCOUNTER — Ambulatory Visit (HOSPITAL_BASED_OUTPATIENT_CLINIC_OR_DEPARTMENT_OTHER)
Admission: RE | Admit: 2019-02-20 | Discharge: 2019-02-20 | Disposition: A | Discharge status: Home / Self Care | Payer: Medicare Other | Attending: Orthopaedic Surgery | Admitting: Orthopaedic Surgery

## 2019-02-20 ENCOUNTER — Other Ambulatory Visit: Payer: Self-pay

## 2019-02-20 ENCOUNTER — Encounter (HOSPITAL_BASED_OUTPATIENT_CLINIC_OR_DEPARTMENT_OTHER)
Admission: RE | Disposition: A | Discharge status: Home / Self Care | Payer: Self-pay | Source: Home / Self Care | Attending: Orthopaedic Surgery

## 2019-02-20 DIAGNOSIS — E785 Hyperlipidemia, unspecified: Secondary | ICD-10-CM | POA: Diagnosis not present

## 2019-02-20 DIAGNOSIS — M205X1 Other deformities of toe(s) (acquired), right foot: Secondary | ICD-10-CM | POA: Diagnosis present

## 2019-02-20 DIAGNOSIS — F319 Bipolar disorder, unspecified: Secondary | ICD-10-CM | POA: Diagnosis not present

## 2019-02-20 DIAGNOSIS — Z9884 Bariatric surgery status: Secondary | ICD-10-CM | POA: Diagnosis not present

## 2019-02-20 DIAGNOSIS — M899 Disorder of bone, unspecified: Secondary | ICD-10-CM | POA: Diagnosis not present

## 2019-02-20 DIAGNOSIS — J449 Chronic obstructive pulmonary disease, unspecified: Secondary | ICD-10-CM | POA: Insufficient documentation

## 2019-02-20 DIAGNOSIS — G4730 Sleep apnea, unspecified: Secondary | ICD-10-CM | POA: Diagnosis not present

## 2019-02-20 DIAGNOSIS — I4891 Unspecified atrial fibrillation: Secondary | ICD-10-CM | POA: Insufficient documentation

## 2019-02-20 DIAGNOSIS — Z7951 Long term (current) use of inhaled steroids: Secondary | ICD-10-CM | POA: Insufficient documentation

## 2019-02-20 DIAGNOSIS — Y838 Other surgical procedures as the cause of abnormal reaction of the patient, or of later complication, without mention of misadventure at the time of the procedure: Secondary | ICD-10-CM | POA: Insufficient documentation

## 2019-02-20 DIAGNOSIS — Z950 Presence of cardiac pacemaker: Secondary | ICD-10-CM | POA: Insufficient documentation

## 2019-02-20 DIAGNOSIS — K219 Gastro-esophageal reflux disease without esophagitis: Secondary | ICD-10-CM | POA: Insufficient documentation

## 2019-02-20 DIAGNOSIS — Z79899 Other long term (current) drug therapy: Secondary | ICD-10-CM | POA: Insufficient documentation

## 2019-02-20 DIAGNOSIS — I69354 Hemiplegia and hemiparesis following cerebral infarction affecting left non-dominant side: Secondary | ICD-10-CM | POA: Insufficient documentation

## 2019-02-20 DIAGNOSIS — Z87891 Personal history of nicotine dependence: Secondary | ICD-10-CM | POA: Insufficient documentation

## 2019-02-20 DIAGNOSIS — E1140 Type 2 diabetes mellitus with diabetic neuropathy, unspecified: Secondary | ICD-10-CM | POA: Diagnosis not present

## 2019-02-20 DIAGNOSIS — F1120 Opioid dependence, uncomplicated: Secondary | ICD-10-CM | POA: Insufficient documentation

## 2019-02-20 DIAGNOSIS — T8189XA Other complications of procedures, not elsewhere classified, initial encounter: Secondary | ICD-10-CM | POA: Insufficient documentation

## 2019-02-20 DIAGNOSIS — M2041 Other hammer toe(s) (acquired), right foot: Secondary | ICD-10-CM | POA: Insufficient documentation

## 2019-02-20 HISTORY — DX: Type 2 diabetes mellitus without complications: E11.9

## 2019-02-20 HISTORY — DX: Other amnesia: R41.3

## 2019-02-20 HISTORY — DX: Fibromyalgia: M79.7

## 2019-02-20 HISTORY — DX: Zoster without complications: B02.9

## 2019-02-20 HISTORY — DX: Chronic obstructive pulmonary disease, unspecified: J44.9

## 2019-02-20 HISTORY — DX: Sleep apnea, unspecified: G47.30

## 2019-02-20 HISTORY — DX: Unspecified osteoarthritis, unspecified site: M19.90

## 2019-02-20 HISTORY — DX: Presence of cardiac pacemaker: Z95.0

## 2019-02-20 HISTORY — DX: Anemia, unspecified: D64.9

## 2019-02-20 HISTORY — DX: Post-traumatic stress disorder, unspecified: F43.10

## 2019-02-20 HISTORY — DX: Depression, unspecified: F32.A

## 2019-02-20 HISTORY — DX: Polyneuropathy, unspecified: G62.9

## 2019-02-20 HISTORY — PX: TENDON LENGTHENING: SHX6786

## 2019-02-20 HISTORY — DX: Restless legs syndrome: G25.81

## 2019-02-20 HISTORY — PX: HAMMER TOE SURGERY: SHX385

## 2019-02-20 HISTORY — DX: Other chronic pain: G89.29

## 2019-02-20 HISTORY — DX: Unspecified atrial fibrillation: I48.91

## 2019-02-20 HISTORY — DX: Nausea with vomiting, unspecified: R11.2

## 2019-02-20 HISTORY — DX: Bipolar disorder, unspecified: F31.9

## 2019-02-20 HISTORY — DX: Gastro-esophageal reflux disease without esophagitis: K21.9

## 2019-02-20 HISTORY — PX: METATARSAL HEAD EXCISION: SHX5027

## 2019-02-20 SURGERY — CORRECTION, HAMMER TOE
Anesthesia: Monitor Anesthesia Care | Site: Toe | Laterality: Right

## 2019-02-20 MED ORDER — DEXAMETHASONE SODIUM PHOSPHATE 10 MG/ML IJ SOLN
INTRAMUSCULAR | Status: AC
  Filled 2019-02-20: qty 1

## 2019-02-20 MED ORDER — CLINDAMYCIN PHOSPHATE 900 MG/50ML IV SOLN
INTRAVENOUS | Status: AC
  Filled 2019-02-20: qty 50

## 2019-02-20 MED ORDER — PHENYLEPHRINE HCL (PRESSORS) 10 MG/ML IV SOLN
INTRAVENOUS | Status: DC | PRN
  Administered 2019-02-20: 80 ug via INTRAVENOUS

## 2019-02-20 MED ORDER — SUCCINYLCHOLINE CHLORIDE 200 MG/10ML IV SOSY
PREFILLED_SYRINGE | INTRAVENOUS | Status: AC
  Filled 2019-02-20: qty 10

## 2019-02-20 MED ORDER — ONDANSETRON HCL 4 MG PO TABS: 4.0000 mg | ORAL_TABLET | Freq: Three times a day (TID) | ORAL | 1 refills | Status: AC | PRN

## 2019-02-20 MED ORDER — ONDANSETRON HCL 4 MG/2ML IJ SOLN: 4.0000 mg | Freq: Once | INTRAMUSCULAR | Status: DC | PRN

## 2019-02-20 MED ORDER — BUPIVACAINE HCL (PF) 0.5 % IJ SOLN
INTRAMUSCULAR | Status: AC
  Filled 2019-02-20: qty 30

## 2019-02-20 MED ORDER — FENTANYL CITRATE (PF) 100 MCG/2ML IJ SOLN
50.0000 ug | INTRAMUSCULAR | Status: DC | PRN
  Administered 2019-02-20: 50 ug via INTRAVENOUS
  Administered 2019-02-20: 25 ug via INTRAVENOUS

## 2019-02-20 MED ORDER — PHENYLEPHRINE HCL (PRESSORS) 10 MG/ML IV SOLN
INTRAVENOUS | Status: AC
  Filled 2019-02-20: qty 1

## 2019-02-20 MED ORDER — PHENYLEPHRINE HCL-NACL 10-0.9 MG/250ML-% IV SOLN
INTRAVENOUS | Status: DC | PRN
  Administered 2019-02-20: 40 ug/min via INTRAVENOUS

## 2019-02-20 MED ORDER — PHENYLEPHRINE 40 MCG/ML (10ML) SYRINGE FOR IV PUSH (FOR BLOOD PRESSURE SUPPORT)
PREFILLED_SYRINGE | INTRAVENOUS | Status: AC
  Filled 2019-02-20: qty 10

## 2019-02-20 MED ORDER — EPHEDRINE SULFATE 50 MG/ML IJ SOLN
INTRAMUSCULAR | Status: DC | PRN
  Administered 2019-02-20: 10 mg via INTRAVENOUS

## 2019-02-20 MED ORDER — PROPOFOL 500 MG/50ML IV EMUL
INTRAVENOUS | Status: AC
  Filled 2019-02-20: qty 50

## 2019-02-20 MED ORDER — MIDAZOLAM HCL 2 MG/2ML IJ SOLN
INTRAMUSCULAR | Status: AC
  Filled 2019-02-20: qty 2

## 2019-02-20 MED ORDER — ONDANSETRON HCL 4 MG/2ML IJ SOLN
INTRAMUSCULAR | Status: DC | PRN
  Administered 2019-02-20: 4 mg via INTRAVENOUS

## 2019-02-20 MED ORDER — DEXAMETHASONE SODIUM PHOSPHATE 10 MG/ML IJ SOLN
INTRAMUSCULAR | Status: DC | PRN
  Administered 2019-02-20: 4 mg via INTRAVENOUS

## 2019-02-20 MED ORDER — OXYCODONE HCL 5 MG PO TABS: 5.0000 mg | ORAL_TABLET | ORAL | 0 refills | Status: AC | PRN

## 2019-02-20 MED ORDER — PROMETHAZINE HCL 25 MG/ML IJ SOLN
INTRAMUSCULAR | Status: AC
  Filled 2019-02-20: qty 1

## 2019-02-20 MED ORDER — LIDOCAINE 2% (20 MG/ML) 5 ML SYRINGE
INTRAMUSCULAR | Status: AC
  Filled 2019-02-20: qty 5

## 2019-02-20 MED ORDER — FENTANYL CITRATE (PF) 100 MCG/2ML IJ SOLN
INTRAMUSCULAR | Status: AC
  Filled 2019-02-20: qty 2

## 2019-02-20 MED ORDER — PROMETHAZINE HCL 25 MG/ML IJ SOLN
6.2500 mg | Freq: Four times a day (QID) | INTRAMUSCULAR | Status: DC | PRN
  Administered 2019-02-20: 14:00:00 6.25 mg via INTRAVENOUS

## 2019-02-20 MED ORDER — ONDANSETRON HCL 4 MG/2ML IJ SOLN
INTRAMUSCULAR | Status: AC
  Filled 2019-02-20: qty 2

## 2019-02-20 MED ORDER — EPHEDRINE 5 MG/ML INJ
INTRAVENOUS | Status: AC
  Filled 2019-02-20: qty 10

## 2019-02-20 MED ORDER — KETOROLAC TROMETHAMINE 30 MG/ML IJ SOLN
INTRAMUSCULAR | Status: AC
  Filled 2019-02-20: qty 1

## 2019-02-20 MED ORDER — CEFAZOLIN SODIUM-DEXTROSE 2-4 GM/100ML-% IV SOLN
INTRAVENOUS | Status: AC
  Filled 2019-02-20: qty 100

## 2019-02-20 MED ORDER — FENTANYL CITRATE (PF) 100 MCG/2ML IJ SOLN
25.0000 ug | INTRAMUSCULAR | Status: DC | PRN
  Administered 2019-02-20: 50 ug via INTRAVENOUS

## 2019-02-20 MED ORDER — BUPIVACAINE HCL (PF) 0.25 % IJ SOLN
INTRAMUSCULAR | Status: DC | PRN
  Administered 2019-02-20: 20 mL

## 2019-02-20 MED ORDER — CLINDAMYCIN PHOSPHATE 900 MG/50ML IV SOLN
900.0000 mg | Freq: Once | INTRAVENOUS | Status: AC
  Administered 2019-02-20: 12:00:00 900 mg via INTRAVENOUS

## 2019-02-20 MED ORDER — LACTATED RINGERS IV SOLN: INTRAVENOUS | Status: DC

## 2019-02-20 MED ORDER — ACETAMINOPHEN 500 MG PO TABS
1000.0000 mg | ORAL_TABLET | Freq: Once | ORAL | Status: AC
  Administered 2019-02-20: 11:00:00 1000 mg via ORAL

## 2019-02-20 MED ORDER — CEFAZOLIN SODIUM-DEXTROSE 2-4 GM/100ML-% IV SOLN: 2.0000 g | INTRAVENOUS | Status: DC

## 2019-02-20 MED ORDER — MIDAZOLAM HCL 2 MG/2ML IJ SOLN: 1.0000 mg | INTRAMUSCULAR | Status: DC | PRN

## 2019-02-20 MED ORDER — POVIDONE-IODINE 10 % EX SWAB: 2.0000 "application " | Freq: Once | CUTANEOUS | Status: DC

## 2019-02-20 MED ORDER — LIDOCAINE 2% (20 MG/ML) 5 ML SYRINGE
INTRAMUSCULAR | Status: DC | PRN
  Administered 2019-02-20: 60 mg via INTRAVENOUS

## 2019-02-20 MED ORDER — KETOROLAC TROMETHAMINE 15 MG/ML IJ SOLN
15.0000 mg | Freq: Once | INTRAMUSCULAR | Status: AC | PRN
  Administered 2019-02-20: 14:00:00 15 mg via INTRAVENOUS

## 2019-02-20 MED ORDER — ACETAMINOPHEN 500 MG PO TABS
ORAL_TABLET | ORAL | Status: AC
  Filled 2019-02-20: qty 2

## 2019-02-20 SURGICAL SUPPLY — 73 items
BANDAGE ESMARK 6X9 LF (GAUZE/BANDAGES/DRESSINGS) ×1 IMPLANT
BENZOIN TINCTURE PRP APPL 2/3 (GAUZE/BANDAGES/DRESSINGS) IMPLANT
BLADE LONG MED 25X9 (BLADE) IMPLANT
BLADE LONG MED 25X9MM (BLADE)
BLADE OSC/SAG .038X5.5 CUT EDG (BLADE) ×3 IMPLANT
BLADE PRESCISION 7.0X.51X18.5 (BLADE) ×3 IMPLANT
BLADE SURG 15 STRL LF DISP TIS (BLADE) ×2 IMPLANT
BLADE SURG 15 STRL SS (BLADE) ×4
BNDG COHESIVE 4X5 TAN STRL (GAUZE/BANDAGES/DRESSINGS) IMPLANT
BNDG CONFORM 2 STRL LF (GAUZE/BANDAGES/DRESSINGS) IMPLANT
BNDG ELASTIC 4X5.8 VLCR STR LF (GAUZE/BANDAGES/DRESSINGS) ×3 IMPLANT
BNDG ELASTIC 6X5.8 VLCR STR LF (GAUZE/BANDAGES/DRESSINGS) ×3 IMPLANT
BNDG ESMARK 4X9 LF (GAUZE/BANDAGES/DRESSINGS) IMPLANT
BNDG ESMARK 6X9 LF (GAUZE/BANDAGES/DRESSINGS) ×3
CHLORAPREP W/TINT 26 (MISCELLANEOUS) ×3 IMPLANT
CLOSURE WOUND 1/2 X4 (GAUZE/BANDAGES/DRESSINGS)
COVER BACK TABLE 60X90IN (DRAPES) ×3 IMPLANT
COVER WAND RF STERILE (DRAPES) IMPLANT
CUFF TOURN SGL QUICK 34 (TOURNIQUET CUFF)
CUFF TRNQT CYL 34X4.125X (TOURNIQUET CUFF) IMPLANT
DECANTER SPIKE VIAL GLASS SM (MISCELLANEOUS) IMPLANT
DRAPE EXTREMITY T 121X128X90 (DISPOSABLE) ×3 IMPLANT
DRAPE IMP U-DRAPE 54X76 (DRAPES) ×3 IMPLANT
DRAPE OEC MINIVIEW 54X84 (DRAPES) ×3 IMPLANT
DRAPE U-SHAPE 47X51 STRL (DRAPES) ×3 IMPLANT
ELECT REM PT RETURN 9FT ADLT (ELECTROSURGICAL) ×3
ELECTRODE REM PT RTRN 9FT ADLT (ELECTROSURGICAL) ×1 IMPLANT
GAUZE SPONGE 4X4 12PLY STRL (GAUZE/BANDAGES/DRESSINGS) ×3 IMPLANT
GAUZE XEROFORM 1X8 LF (GAUZE/BANDAGES/DRESSINGS) ×3 IMPLANT
GLOVE BIO SURGEON STRL SZ 6.5 (GLOVE) ×2 IMPLANT
GLOVE BIO SURGEONS STRL SZ 6.5 (GLOVE) ×1
GLOVE BIOGEL M STRL SZ7.5 (GLOVE) ×3 IMPLANT
GLOVE BIOGEL PI IND STRL 7.0 (GLOVE) ×2 IMPLANT
GLOVE BIOGEL PI IND STRL 8 (GLOVE) ×1 IMPLANT
GLOVE BIOGEL PI INDICATOR 7.0 (GLOVE) ×4
GLOVE BIOGEL PI INDICATOR 8 (GLOVE) ×2
GOWN STRL REUS W/ TWL LRG LVL3 (GOWN DISPOSABLE) ×1 IMPLANT
GOWN STRL REUS W/ TWL XL LVL3 (GOWN DISPOSABLE) ×1 IMPLANT
GOWN STRL REUS W/TWL LRG LVL3 (GOWN DISPOSABLE) ×2
GOWN STRL REUS W/TWL XL LVL3 (GOWN DISPOSABLE) ×2
K-WIRE .035X4 (WIRE) ×6 IMPLANT
K-WIRE .045X4 (WIRE) ×6 IMPLANT
NEEDLE HYPO 22GX1.5 SAFETY (NEEDLE) IMPLANT
NS IRRIG 1000ML POUR BTL (IV SOLUTION) ×3 IMPLANT
PACK BASIN DAY SURGERY FS (CUSTOM PROCEDURE TRAY) ×3 IMPLANT
PAD CAST 4YDX4 CTTN HI CHSV (CAST SUPPLIES) ×1 IMPLANT
PADDING CAST COTTON 4X4 STRL (CAST SUPPLIES) ×2
PADDING CAST SYNTHETIC 4 (CAST SUPPLIES) ×2
PADDING CAST SYNTHETIC 4X4 STR (CAST SUPPLIES) ×1 IMPLANT
PENCIL SMOKE EVACUATOR (MISCELLANEOUS) ×3 IMPLANT
SHEET MEDIUM DRAPE 40X70 STRL (DRAPES) ×3 IMPLANT
SLEEVE SCD COMPRESS KNEE MED (MISCELLANEOUS) ×3 IMPLANT
SPLINT FIBERGLASS 4X30 (CAST SUPPLIES) IMPLANT
SPONGE LAP 18X18 RF (DISPOSABLE) IMPLANT
STOCKINETTE 6  STRL (DRAPES) ×2
STOCKINETTE 6 STRL (DRAPES) ×1 IMPLANT
STRIP CLOSURE SKIN 1/2X4 (GAUZE/BANDAGES/DRESSINGS) IMPLANT
SUCTION FRAZIER HANDLE 10FR (MISCELLANEOUS) ×2
SUCTION TUBE FRAZIER 10FR DISP (MISCELLANEOUS) ×1 IMPLANT
SUT ETHILON 3 0 PS 1 (SUTURE) ×3 IMPLANT
SUT FIBERWIRE 2-0 18 17.9 3/8 (SUTURE)
SUT MNCRL AB 3-0 PS2 18 (SUTURE) ×3 IMPLANT
SUT PDS AB 2-0 CT2 27 (SUTURE) ×3 IMPLANT
SUT VIC AB 2-0 SH 27 (SUTURE)
SUT VIC AB 2-0 SH 27XBRD (SUTURE) IMPLANT
SUT VIC AB 3-0 FS2 27 (SUTURE) IMPLANT
SUTURE FIBERWR 2-0 18 17.9 3/8 (SUTURE) IMPLANT
SYR BULB 3OZ (MISCELLANEOUS) ×3 IMPLANT
SYR CONTROL 10ML LL (SYRINGE) IMPLANT
TOWEL GREEN STERILE FF (TOWEL DISPOSABLE) ×6 IMPLANT
TUBE CONNECTING 20'X1/4 (TUBING) ×1
TUBE CONNECTING 20X1/4 (TUBING) ×2 IMPLANT
UNDERPAD 30X36 HEAVY ABSORB (UNDERPADS AND DIAPERS) ×3 IMPLANT

## 2019-02-20 NOTE — Anesthesia Procedure Notes (Addendum)
Procedure Name: LMA Insertion Date/Time: 02/20/2019 12:09 PM Performed by: Ronnette Hila, CRNA Pre-anesthesia Checklist: Patient identified, Emergency Drugs available, Suction available and Patient being monitored Patient Re-evaluated:Patient Re-evaluated prior to induction Oxygen Delivery Method: Circle system utilized Preoxygenation: Pre-oxygenation with 100% oxygen Induction Type: IV induction Ventilation: Mask ventilation without difficulty LMA: LMA inserted LMA Size: 4.0 Number of attempts: 1 Airway Equipment and Method: Bite block Placement Confirmation: positive ETCO2 Tube secured with: Tape Dental Injury: Teeth and Oropharynx as per pre-operative assessment

## 2019-02-20 NOTE — Anesthesia Preprocedure Evaluation (Addendum)
Anesthesia Evaluation  Patient identified by MRN, date of birth, ID band Patient awake    Reviewed: Allergy & Precautions, NPO status , Patient's Chart, lab work & pertinent test results  History of Anesthesia Complications (+) PONV and history of anesthetic complications  Airway Mallampati: II  TM Distance: >3 FB Neck ROM: Full    Dental  (+) Edentulous Upper, Edentulous Lower   Pulmonary sleep apnea and Continuous Positive Airway Pressure Ventilation , COPD,  COPD inhaler, former smoker,    Pulmonary exam normal breath sounds clear to auscultation       Cardiovascular Normal cardiovascular exam+ dysrhythmias Atrial Fibrillation + pacemaker  Rhythm:Regular Rate:Normal     Neuro/Psych PSYCHIATRIC DISORDERS Anxiety Depression Bipolar Disorder PTSD (post-traumatic stress disorder)Left sided weakness CVA, Residual Symptoms    GI/Hepatic Neg liver ROS, GERD  Medicated and Controlled,  Endo/Other  diabetes  Renal/GU negative Renal ROS     Musculoskeletal  (+) Arthritis , Fibromyalgia -, narcotic dependent  Abdominal   Peds  Hematology HLD   Anesthesia Other Findings RIGHT 3RD AND 4TH HAMMERTOES  Reproductive/Obstetrics                            Anesthesia Physical Anesthesia Plan  ASA: III  Anesthesia Plan: General   Post-op Pain Management:    Induction: Intravenous  PONV Risk Score and Plan: 4 or greater and Ondansetron, Dexamethasone, Midazolam and Treatment may vary due to age or medical condition  Airway Management Planned: LMA  Additional Equipment:   Intra-op Plan:   Post-operative Plan: Extubation in OR  Informed Consent: I have reviewed the patients History and Physical, chart, labs and discussed the procedure including the risks, benefits and alternatives for the proposed anesthesia with the patient or authorized representative who has indicated his/her understanding and  acceptance.       Plan Discussed with: CRNA  Anesthesia Plan Comments: (Patient declined regional anesthesia )      Anesthesia Quick Evaluation

## 2019-02-20 NOTE — Op Note (Addendum)
Lindsay Payne female 69 y.o. 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 corrective surgery Right fourth toe hammertoe Right plantar fifth metatarsal exostosis  PostOperative Diagnosis: Same  PROCEDURE: Right third toe MTP capsulotomy - 28270 Right third toe extensor tendon lysis of adhesions - 27680 Right third toe extensor tendon lengthening - 28234 Right third toe corrective osteotomy and fusion of PIP joint - 28285 Right fourth toe PIP arthroplasty - 28285 Right third toe flexor tenotomy - 28232 Right fifth metatarsal head plantar exostectomy - 28122  SURGEON: Dub Mikes, MD  ASSISTANT: None  ANESTHESIA: General with local infiltrate  FINDINGS: See postoperative diagnosis  IMPLANTS: 045 and 035 K wire fixation  INDICATIONS:68 y.o. female had hammertoe surgery several years ago.  She had an extension malunion of her third toe and developed extensor tendon adhesions with elevation of the toe at the MTP joint.  She also had deviation of her fourth toe in a valgus position with flexor tendon contracture.  She also had developed a plantar exostosis on the base of her fifth metatarsal head causing painful callus formation due to prior bunionette correction.  Given these findings she was indicated for revision forefoot surgery as noted above.  She understood the risk benefits alternatives surgery which include but not limited to wound healing complications, infection, nonunion, malunion, need for further surgery and damage to surrounding structures.  She also understood the nature of revision hammertoe surgery and the possibility of necrosis of the toes and the need for amputation.  She also understood the difficulty with corrective toe surgery and the possibility of recurrence.  After weighing these risks he opted proceed with  surgery.  PROCEDURE: Patient was identified the preoperative holding area.  The right leg was marked myself.  Consent was signed myself and the patient.  She was taken the operative suite placed supine the operative table.  General LMA anesthesia was induced out difficulty.  Preoperative antibiotics were given.  A bump was placed under the right hip and bone foam was used.  All bony prominences were well-padded.  Preoperative antibiotics were given.  Right lower extremity was prepped and draped in usual sterile fashion.  Timeout performed.  We began by making a longitudinal incision overlying the third toe in line with the toe and over the site of the malunion of the prior arthrodesis site and her MTP joint.  This taken sharply down skin subcutaneous tissue.  Then skin flaps were created medially and laterally.  The dorsiflexion malunion was identified.  Then the extensor digitorum longus tendon was identified mobilized.  This was transected distally.  Then the extensor digitorum brevis tendon was identified and mobilized.  This was transected proximally.  Then the MTP joint was identified.  A capsulotomy was created.  The medial and lateral collateral ligaments were released as were the joint capsular tissues.  This was to allow the joint to sit in a more plantarflexed position.  The extensor digitorum longus tendon was adhesed to this tissue distally.  This was mobilized sharply with 15 blade and with dissecting scissors to perform lysis of adhesions of the extensor digitorum longus tendon.  Then we turned our attention to the fourth toe.  There was valgus deformity through the PIP joint.  An incision was made overlying this deformed joint.  It was taken sharply down through skin and subcutaneous tissue.  The medial lateral skin flaps were created.  The site of the PIP joint was identified.  It was opened up and the collateral ligaments were transected medially and laterally.  Then using a sagittal saw the  distal aspect the proximal phalanx and the proximal aspect the distal phalanx was removed.  This was done in a way that would allow for straightening of the toe due to the deformity.  Then a sagittal saw was used on the third toe as well through the prior malunion site.  A small wedge was taken out to allow for correction of the position of the toe in a plantarflexed position.  Then 4 5 and 3 5 K wires were used to fix the osteotomy sites and better position that was acceptable.  This was done for the fourth toe and the third toe.  Then we turned our attention to the lateral foot.  The site of bony exostosis was identified and incision was made on the lateral border of the fifth metatarsal head.  This was taken sharply down through skin and subcutaneous tissue.  Then the joint capsule was identified as was the extensor digit he minimi muscle belly.  This was mobilized to allow for visualization of the plantar aspect of the fifth metatarsal head and the site of the exostosis.  There is prominent pointed exostosis right below the level of the callus.  Sagittal saw was used to remove this exostosis and it was completely removed.  Manual manipulation was used to the overlying skin to ensure adequate bony resection.  This was acceptable.  Then the tourniquet was released.  The foot was placed in a normal plantarflexed position and the K wires were run across the sites of the osteotomies.  Then on the third toe the toe was placed in a more plantarflexed position at the MTP joint and the K wire was run across the MTP joint to allow for stability there.  The toes were in acceptable position.  Then irrigation was used to irrigate the wounds copiously with normal saline.  Then using 3-0 Monocryl the extensor digitorum longus tendon was transferred to the extensor digitorum brevis tendon of the third toe.  This was done with the ankle in a dorsiflexed position.  Then the deep tissue was closed with 3-0 Monocryl.  The  superficial tissue was closed with a 3-0 nylon.  Soft dressing including Xeroform, 4 x 4's and sterile she cotton were placed.  Forge Ace wrap was placed.  She was awakened from anesthesia and taken recovery in stable condition.  There were no complications.  All counts were correct at the end of the case.  In the postoperative recovery area after transfer to a wheelchair she had some strikethrough and bleeding through her bandages.  The bandages were changed in the postoperative recovery room to include a bolstered 4 x 4 dressing with sterile she cotton and Ace wrap.  She tolerated this well.  There was no active bleeding after dressing removal.  POST OPERATIVE INSTRUCTIONS: Weightbearing as tolerated in a postoperative shoe Keep dressing in place Call the office with concerns Follow-up in 2 weeks for nonweightbearing x-rays of the right foot and suture removal if appropriate  BLOOD LOSS:  less than 100 mL         DRAINS: none         SPECIMEN: none       COMPLICATIONS:  * No complications entered in OR log *         Disposition: PACU - hemodynamically stable.  Condition: stable

## 2019-02-20 NOTE — Transfer of Care (Signed)
Immediate Anesthesia Transfer of Care Note  Patient: Lindsay Payne  Procedure(s) Performed: RIGHT THIRD TOE EXTENSOR TENDON LENGTHENING AND METATARSALPHALANGEAL CAPSULOTOMY, FOURTH TOE HAMMERTOE CORRECTION (Right Toe) EXTENSOR TENDON LENGTHENING (Right Toe) Partial excision fifth metatarsal head (Right Toe)  Patient Location: PACU  Anesthesia Type:General  Level of Consciousness: sedated  Airway & Oxygen Therapy: Patient Spontanous Breathing and Patient connected to face mask oxygen  Post-op Assessment: Report given to RN and Post -op Vital signs reviewed and stable  Post vital signs: Reviewed and stable  Last Vitals:  Vitals Value Taken Time  BP    Temp    Pulse    Resp    SpO2      Last Pain:  Vitals:   02/20/19 1033  TempSrc: Oral  PainSc: 7       Patients Stated Pain Goal: 3 (02/20/19 1033)  Complications: No apparent anesthesia complications

## 2019-02-20 NOTE — H&P (Signed)
Lindsay Payne is an 69 y.o. female.   Chief Complaint: Recurrent forefoot deformities right foot HPI: Lindsay Payne is here today for revision hammertoe correction of her third and fourth toes.  She has elevation of the third toe with tethering of her extensor tendon and adhesions.  She also has deviation and a valgus deformity of her fourth toe about the IP joint.  She has a plantar callus with bony prominence on the plantar aspect of her fifth metatarsal head.  Patient's been dealing with this for years.  She had prior correction by Dr. Renae Fickle several years ago.  She has difficulty with shoe wear.  She has discomfort.  She is interested in having them revised.  Past Medical History:  Diagnosis Date  . Anemia   . Arthritis   . Atrial fibrillation (HCC)   . Bipolar 1 disorder (HCC)   . Chronic pain   . COPD (chronic obstructive pulmonary disease) (HCC)   . Depression   . Diabetes mellitus without complication (HCC)    Type 2, diet controlled  . Fibromyalgia   . GERD (gastroesophageal reflux disease)   . Memory loss   . Neuropathy   . Pacemaker   . PONV (postoperative nausea and vomiting)   . PTSD (post-traumatic stress disorder)   . Restless leg   . Shingles   . Sleep apnea    uses CPAP  . Stroke [facility]) 2016   weakness on L side    Past Surgical History:  Procedure Laterality Date  . ABDOMINAL HYSTERECTOMY    . APPENDECTOMY    . BACK SURGERY     cervical, L4-5  . BREAST SURGERY    . BUNIONECTOMY Bilateral   . CHOLECYSTECTOMY    . GASTRIC BYPASS  2017  . INSERT / REPLACE / REMOVE PACEMAKER    . SHOULDER ARTHROSCOPY Bilateral     History reviewed. No pertinent family history. Social History:  reports that she quit smoking about 3 years ago. She has never used smokeless tobacco. She reports previous alcohol use. She reports that she does not use drugs.  Allergies:  Allergies  Allergen Reactions  . Lipitor [Atorvastatin] Other (See Comments)    Muscle Weakness  . Mucinex  [Guaifenesin Er] Hives  . Ambien [Zolpidem] Other (See Comments)    Pt went "crazy"  . Codeine Nausea And Vomiting  . Hydrocodone Itching  . Metformin And Related Nausea Only  . Penicillins Rash  . Sulfa Antibiotics Nausea And Vomiting    Medications Prior to Admission  Medication Sig Dispense Refill  . albuterol (ACCUNEB) 1.25 MG/3ML nebulizer solution Take 1 ampule by nebulization every 6 (six) hours as needed for wheezing.    . busPIRone (BUSPAR) 10 MG tablet Take 10 mg by mouth 2 (two) times daily.    . cariprazine (VRAYLAR) capsule Take 3 mg by mouth daily.    . diclofenac Sodium (VOLTAREN) 1 % GEL Apply 2 g topically 4 (four) times daily.    . eszopiclone (LUNESTA) 1 MG TABS tablet Take 1 mg by mouth at bedtime as needed for sleep. Take immediately before bedtime    . fluticasone furoate-vilanterol (BREO ELLIPTA) 100-25 MCG/INH AEPB Inhale 1 puff into the lungs daily.    Marland Kitchen gabapentin (NEURONTIN) 300 MG capsule Take 600 mg by mouth 2 (two) times daily.    Marland Kitchen lamoTRIgine (LAMICTAL) 200 MG tablet Take 200 mg by mouth daily.    Marland Kitchen lidocaine (LIDODERM) 5 % Place 1 patch onto the skin daily. Remove &  Discard patch within 12 hours or as directed by MD    . linaclotide (LINZESS) 145 MCG CAPS capsule Take 145 mcg by mouth daily before breakfast.    . loratadine (CLARITIN) 10 MG tablet Take 10 mg by mouth daily.    . methocarbamol (ROBAXIN) 750 MG tablet Take 750 mg by mouth 4 (four) times daily.    . metoprolol succinate (TOPROL-XL) 25 MG 24 hr tablet Take 12.5 mg by mouth 2 (two) times daily.    . naloxone (NARCAN) nasal spray 4 mg/0.1 mL Place 1 spray into the nose.    . naproxen sodium (ALEVE) 220 MG tablet Take 220 mg by mouth.    . pantoprazole (PROTONIX) 20 MG tablet Take 20 mg by mouth daily.    . phenytoin (DILANTIN) 100 MG ER capsule Take by mouth 3 (three) times daily.    . QUEtiapine (SEROQUEL) 200 MG tablet Take 200 mg by mouth at bedtime.    . rosuvastatin (CRESTOR) 5 MG tablet  Take 5 mg by mouth daily.    . sertraline (ZOLOFT) 100 MG tablet Take 100 mg by mouth daily.    . Tapentadol HCl (NUCYNTA) 100 MG TABS Take by mouth.    . traMADol (ULTRAM) 50 MG tablet Take by mouth every 6 (six) hours as needed.    . traZODone (DESYREL) 100 MG tablet Take 100 mg by mouth at bedtime.    . vitamin B-12 (CYANOCOBALAMIN) 1000 MCG tablet Take 1,000 mcg by mouth daily.    . nitroGLYCERIN (NITROSTAT) 0.3 MG SL tablet Place 0.3 mg under the tongue every 5 (five) minutes as needed for chest pain.      No results found for this or any previous visit (from the past 48 hour(s)). No results found.  Review of Systems  Constitutional: Negative.   HENT: Negative.   Eyes: Negative.   Respiratory: Negative.   Cardiovascular: Negative.   Endocrine: Negative.   Musculoskeletal:       Right toe pain  Skin: Negative.   Neurological: Negative.   Psychiatric/Behavioral: Negative.     Blood pressure 133/86, pulse 66, temperature 98.3 F (36.8 C), temperature source Oral, resp. rate 16, height 5\' 5"  (1.651 m), weight 72.7 kg, SpO2 99 %. Physical Exam  Constitutional: She appears well-developed.  HENT:  Head: Normocephalic.  Eyes: Conjunctivae are normal.  Cardiovascular: Normal rate.  Respiratory: Effort normal.  GI: Soft.  Musculoskeletal:     Cervical back: Neck supple.     Comments: Right foot demonstrates evidence of prior surgical correction of third and fourth toes with a valgus deviation of the fourth toe and elevation of the third toe.  She has tightness of her extensor tendon mechanism to the third toe.  She also has a plantar callus on the plantar aspect of the fifth metatarsal head region at the site of her prior bunionette correction.  There is a bony prominence plantarly.  She has tenderness to palpation about this area.  Her third toe is passively correctable with complete relaxation but with activation of the muscles it elevates.  Sensation grossly intact to light touch  about the toes.  Foot is warm and well-perfused.  Neurological: She is alert.  Skin: Skin is warm.  Psychiatric: She has a normal mood and affect.     Assessment/Plan We will plan for third toe MTP capsulotomy with lysis of adhesions of the extensor tendon and lengthening.  We may have to perform correction of malunion of her prior hammertoe arthrodesis site.  Hopefully we will avoid this.  We will also perform a PIP arthroplasty of her fourth toe with flexor tenotomy and pinning.  We will also perform bony exostectomy of the fifth metatarsal head.  She understands the risk benefits alternatives which include but are not limited to wound healing complications, infection, nonunion, malunion, need for further surgery and damage to surrounding structures.  After weighing these risks he opted proceed with surgery.  Erle Crocker, MD 02/20/2019, 11:36 AM

## 2019-02-20 NOTE — Discharge Instructions (Signed)
DR. Susa Simmonds [facility]OT [facility] ANKLE SURGERY POST-OP INSTRUCTIONS   Pain Management 1. The numbing medicine and your leg will last around 18 hours, take a dose of your pain medicine as soon as you feel it wearing off to avoid rebound pain. 2. Keep your foot elevated above heart level.  Make sure that your heel hangs free ('floats'). 3. Take all prescribed medication as directed. 4. If taking narcotic pain medication you may want to use an over-the-counter stool softener to avoid constipation. 5. You may take over-the-counter NSAIDs (ibuprofen, naproxen, etc.) as well as over-the-counter acetaminophen as directed on the packaging as a supplement for your pain and may also use it to wean away from the prescription medication.  Activity ? Full WB in post operative shoe. ? Keep dressing in place  First Postoperative Visit 1. Your first postop visit will be at least 2 weeks after surgery.  This should be scheduled when you schedule surgery. 2. If you do not have a postoperative visit scheduled please call [phone] to schedule an appointment. 3. At the appointment your incision will be evaluated for suture removal, x-rays will be obtained if necessary.  General Instructions 1. Swelling is very common after foot and ankle surgery.  It often takes 3 months for the foot and ankle to begin to feel comfortable.  Some amount of swelling will persist for 6-12 months. 2. DO NOT change the dressing.  If there is a problem with the dressing (too tight, loose, gets wet, etc.) please contact Dr. Donnie Mesa office. 3. DO NOT get the dressing wet.  For showers you can use an over-the-counter cast cover or wrap a washcloth around the top of your dressing and then cover it with a plastic bag and tape it to your leg. 4. DO NOT soak the incision (no tubs, pools, bath, etc.) until you have approval from Dr. Susa Simmonds.  Contact Dr. Garret Reddish office or go to Emergency Room if: 1. Temperature above 101 F. 2. Increasing pain that is  unresponsive to pain medication or elevation 3. Excessive redness or swelling in your foot 4. Dressing problems - excessive bloody drainage, looseness or tightness, or if dressing gets wet 5. Develop pain, swelling, warmth, or discoloration of your calf  Post Anesthesia Home Care Instructions  Activity: Get plenty of rest for the remainder of the day. A responsible individual must stay with you for 24 hours following the procedure.  For the next 24 hours, DO NOT: -Drive a car -Advertising copywriter -Drink alcoholic beverages -Take any medication unless instructed by your physician -Make any legal decisions or sign important papers.  Meals: Start with liquid foods such as gelatin or soup. Progress to regular foods as tolerated. Avoid greasy, spicy, heavy foods. If nausea and/or vomiting occur, drink only clear liquids until the nausea and/or vomiting subsides. Call your physician if vomiting continues.  Special Instructions/Symptoms: Your throat may feel dry or sore from the anesthesia or the breathing tube placed in your throat during surgery. If this causes discomfort, gargle with warm salt water. The discomfort should disappear within 24 hours.  If you had a scopolamine patch placed behind your ear for the management of post- operative nausea and/or vomiting:  1. The medication in the patch is effective for 72 hours, after which it should be removed.  Wrap patch in a tissue and discard in the trash. Wash hands thoroughly with soap and water. 2. You may remove the patch earlier than 72 hours if you experience unpleasant side effects which  may include dry mouth, dizziness or visual disturbances. 3. Avoid touching the patch. Wash your hands with soap and water after contact with the patch.

## 2019-02-20 NOTE — Anesthesia Postprocedure Evaluation (Signed)
Anesthesia Post Note  Patient: Lindsay Payne  Procedure(s) Performed: RIGHT THIRD TOE EXTENSOR TENDON LENGTHENING AND METATARSALPHALANGEAL CAPSULOTOMY, FOURTH TOE HAMMERTOE CORRECTION (Right Toe) EXTENSOR TENDON LENGTHENING (Right Toe) Partial excision fifth metatarsal head (Right Toe)     Patient location during evaluation: PACU Anesthesia Type: General Level of consciousness: awake and alert Pain management: pain level controlled Vital Signs Assessment: post-procedure vital signs reviewed and stable Respiratory status: spontaneous breathing, nonlabored ventilation, respiratory function stable and patient connected to nasal cannula oxygen Cardiovascular status: blood pressure returned to baseline and stable Postop Assessment: no apparent nausea or vomiting Anesthetic complications: no    Last Vitals:  Vitals:   02/20/19 1430 02/20/19 1515  BP: 140/71 139/82  Pulse: 60 68  Resp: 16 16  Temp:  36.7 C  SpO2: 98% 98%    Last Pain:  Vitals:   02/20/19 1515  TempSrc:   PainSc: 3                  Omid Deardorff P Bryony Kaman

## 2019-02-21 ENCOUNTER — Encounter: Payer: Self-pay | Admitting: *Deleted
